# Patient Record
Sex: Male | Born: 1965
Health system: Southern US, Community
[De-identification: ages and names within clinical notes are randomized; demographics above are authoritative.]

## PROBLEM LIST (undated history)

## (undated) DIAGNOSIS — E119 Type 2 diabetes mellitus without complications: Secondary | ICD-10-CM

## (undated) DIAGNOSIS — I10 Essential (primary) hypertension: Secondary | ICD-10-CM

## (undated) DIAGNOSIS — E785 Hyperlipidemia, unspecified: Secondary | ICD-10-CM

---

## 2011-02-14 ENCOUNTER — Encounter: Payer: Self-pay | Admitting: Emergency Medicine

## 2011-02-14 ENCOUNTER — Inpatient Hospital Stay (INDEPENDENT_AMBULATORY_CARE_PROVIDER_SITE_OTHER)
Admission: RE | Admit: 2011-02-14 | Discharge: 2011-02-14 | Disposition: A | Discharge status: Home / Self Care | Payer: BC Managed Care – PPO | Source: Ambulatory Visit | Attending: Emergency Medicine | Admitting: Emergency Medicine

## 2011-02-14 DIAGNOSIS — I10 Essential (primary) hypertension: Secondary | ICD-10-CM | POA: Insufficient documentation

## 2011-02-14 DIAGNOSIS — E785 Hyperlipidemia, unspecified: Secondary | ICD-10-CM | POA: Insufficient documentation

## 2011-02-14 DIAGNOSIS — J0100 Acute maxillary sinusitis, unspecified: Secondary | ICD-10-CM

## 2011-02-14 DIAGNOSIS — J029 Acute pharyngitis, unspecified: Secondary | ICD-10-CM | POA: Insufficient documentation

## 2011-02-14 DIAGNOSIS — H66009 Acute suppurative otitis media without spontaneous rupture of ear drum, unspecified ear: Secondary | ICD-10-CM | POA: Insufficient documentation

## 2011-02-14 LAB — CONVERTED CEMR LAB: Rapid Strep: NEGATIVE

## 2011-08-05 NOTE — Progress Notes (Signed)
Summary: SORE THROAT (room 5)   Vital Signs:  Patient Profile:   45 Years Old Male CC:      URI and sore throat  x 2 days Height:     66 inches Weight:      175 pounds O2 Sat:      97 % O2 treatment:    Room Air Temp:     98.5 degrees F oral Pulse rate:   67 / minute Resp:     16 per minute BP sitting:   134 / 84  (left arm) Cuff size:   regular  Johnathan Warner. in pain?   no  Vitals Entered By: Lavell Islam RN (February 14, 2011 8:07 PM)                   Updated Prior Medication List: * OMLODEPHINE daily * ASA 81 MG 2 per day  Current Allergies: ! SULFAHistory of Present Illness Chief Complaint: URI and sore throat  x 2 days History of Present Illness: Johnathan Warner complains of 4 days of nasal and sinus congestion and sore throat. Green nasal mucus. + bilat ear pain.(intensity 4/10) Mild cough,NP. No dyspnea. No chest pain. No wheezing.  No nausea No vomiting.  + fever, No chills. No abd pain, N or V.  Tried otc meds without help.  REVIEW OF SYSTEMS Constitutional Symptoms      Denies fever, chills, night sweats, weight loss, weight gain, and fatigue.  Eyes       Denies change in vision, eye pain, eye discharge, glasses, contact lenses, and eye surgery. Ear/Nose/Throat/Mouth       Complains of ear pain and sore throat.      Denies hearing loss/aids, change in hearing, ear discharge, dizziness, frequent runny nose, frequent nose bleeds, sinus problems, hoarseness, and tooth pain or bleeding.  Respiratory       Complains of dry cough.      Denies productive cough, wheezing, shortness of breath, asthma, bronchitis, and emphysema/COPD.  Cardiovascular       Denies murmurs, chest pain, and tires easily with exhertion.    Gastrointestinal       Denies stomach pain, nausea/vomiting, diarrhea, constipation, blood in bowel movements, and indigestion. Genitourniary       Denies painful urination, kidney stones, and loss of urinary control. Neurological       Complains of headaches.       Denies paralysis, seizures, and fainting/blackouts. Musculoskeletal       Denies muscle pain, joint pain, joint stiffness, decreased range of motion, redness, swelling, muscle weakness, and gout.  Skin       Denies bruising, unusual mles/lumps or sores, and hair/skin or nail changes.  Psych       Denies mood changes, temper/anger issues, anxiety/stress, speech problems, depression, and sleep problems. Other Comments: URI and sore throat x 2 days   Past History:  Family History: Last updated: 02/14/2011 Family History Breast cancer 1st degree relative <50 Family History of CAD Male 1st degree relative <50 Family History Diabetes 1st degree relative  Social History: Last updated: 02/14/2011 Married Never Smoked Alcohol use-no Drug use-no  Past Medical History: Hyperlipidemia (had to d/c meds 3 months ago due to abnormal liver panel) Hypertension  Past Surgical History: Denies surgical history  Family History: Family History Breast cancer 1st degree relative <50 Family History of CAD Male 1st degree relative <50 Family History Diabetes 1st degree relative  Social History: Married Never Smoked Alcohol use-no Drug use-no Smoking Status:  never Drug Use:  no Physical Exam General appearance: well developed, well nourished, no acute distress. Fatigued. Head: normocephalic, atraumatic. + mild maxillary ttp bilat Eyes: conjunctivae and lids normal Ears: redsness, distortion, and inflammation bilateral TM's. canals wnl Nasal: swollen red turbinates with congestion and yellow-green mucus. Oral/Pharynx: pharyngeal erythema without exudate, uvula midline without deviation Neck: supple,anterior lymphadenopathy present Chest/Lungs: no rales, wheezes, or rhonchi bilateral, breath sounds equal without effort Heart: regular rate and  rhythm, no murmur Neurological: grossly intact and non-focal Skin: no obvious rashes or lesions MSE: oriented to time, place, and  person Assessment New Problems: ACUTE PHARYNGITIS (ICD-462) ACUTE MAXILLARY SINUSITIS (ICD-461.0) ACUT SUPPRATV OTITIS MEDIA W/O SPONT RUP EARDRUM (ICD-382.00) FAMILY HISTORY DIABETES 1ST DEGREE RELATIVE (ICD-V18.0) FAMILY HISTORY OF CAD MALE 1ST DEGREE RELATIVE <50 (ICD-V17.3) FAMILY HISTORY BREAST CANCER 1ST DEGREE RELATIVE <50 (ICD-V16.3) HYPERTENSION (ICD-401.9) HYPERLIPIDEMIA (ICD-272.4)  RST neg.  Patient Education: Patient and/or caregiver instructed in the following: rest, fluids, Tylenol prn, Ibuprofen prn. otc tx discussed. mucinex.   Plan New Medications/Changes: AUGMENTIN 875-125 MG TABS (AMOXICILLIN-POT CLAVULANATE) 1 by mouth two times a day pc for 10 days  #20 x 0, 02/14/2011, Lajean Manes MD  New Orders: Rapid Strep [id number] New Patient Level III [99203] Services provided After hours-Weekends-Holidays [99051]  The patient and/or caregiver has been counseled thoroughly with regard to medications prescribed including dosage, schedule, interactions, rationale for use, and possible side effects and they verbalize understanding.  Diagnoses and expected course of recovery discussed and will return if not improved as expected or if the condition worsens. Patient and/or caregiver verbalized understanding.  Prescriptions: AUGMENTIN 875-125 MG TABS (AMOXICILLIN-POT CLAVULANATE) 1 by mouth two times a day pc for 10 days  #20 x 0   Entered and Authorized by:   Lajean Manes MD   Signed by:   Lajean Manes MD on 02/14/2011   Method used:   Handwritten   RxID:   [id number]   Orders Added: 1)  Rapid Strep [95621] 2)  New Patient Level III [30865] 3)  Services provided After hours-Weekends-Holidays [78469]    Laboratory Results  Date/Time Received: February 14, 2011 8:12 PM  Date/Time Reported: February 14, 2011 8:12 PM   Other Tests  Rapid Strep: negative  Kit Test Internal QC: Negative   (Normal Range: Negative)

## 2012-07-09 ENCOUNTER — Emergency Department
Admission: EM | Admit: 2012-07-09 | Discharge: 2012-07-09 | Disposition: A | Discharge status: Home / Self Care | Payer: BC Managed Care – PPO | Source: Home / Self Care | Attending: Family Medicine | Admitting: Family Medicine

## 2012-07-09 ENCOUNTER — Encounter: Payer: Self-pay | Admitting: *Deleted

## 2012-07-09 DIAGNOSIS — J069 Acute upper respiratory infection, unspecified: Secondary | ICD-10-CM

## 2012-07-09 HISTORY — DX: Essential (primary) hypertension: I10

## 2012-07-09 HISTORY — DX: Type 2 diabetes mellitus without complications: E11.9

## 2012-07-09 HISTORY — DX: Hyperlipidemia, unspecified: E78.5

## 2012-07-09 MED ORDER — AMOXICILLIN 875 MG PO TABS: 875.0000 mg | ORAL_TABLET | Freq: Two times a day (BID) | ORAL | Status: DC

## 2012-07-09 MED ORDER — BENZONATATE 200 MG PO CAPS: 200.0000 mg | ORAL_CAPSULE | Freq: Every day | ORAL | Status: DC

## 2012-07-09 NOTE — ED Provider Notes (Signed)
History     CSN: [id number]  Arrival date & time 07/09/12  1807   First MD Initiated Contact with Patient 07/09/12 1821      Chief Complaint  Patient presents with  . Sore Throat      HPI Comments: Patient complains of onset yesterday of scratchy throat, sneezing, dizziness, fatigue, watery eyes, myalgias, and a mild cough.  No fevers, chills, and sweats   The history is provided by the patient.    Past Medical History  Diagnosis Date  . Hypertension   . Hyperlipemia   . Diabetes mellitus without complication     History reviewed. No pertinent past surgical history.  Family history:  Diabetes mother  History  Substance Use Topics  . Smoking status: Former Games developer  . Smokeless tobacco: Never Used  . Alcohol Use: No      Review of Systems + sore throat + cough No pleuritic pain No wheezing + nasal congestion + post-nasal drainage No sinus pain/pressure No itchy/red eyes No earache No hemoptysis No SOB No fever/chills No nausea No vomiting No abdominal pain No diarrhea No urinary symptoms No skin rashes + fatigue + myalgias No headache Used OTC meds without relief  Allergies  Sulfonamide derivatives  Home Medications   Current Outpatient Rx  Name  Route  Sig  Dispense  Refill  . GLIPIZIDE 10 MG PO TABS   Oral   Take 10 mg by mouth 2 (two) times daily before a meal.         . LISINOPRIL 10 MG PO TABS   Oral   Take 10 mg by mouth daily.         Marland Kitchen SIMVASTATIN 10 MG PO TABS   Oral   Take 10 mg by mouth at bedtime.         . AMOXICILLIN 875 MG PO TABS   Oral   Take 1 tablet (875 mg total) by mouth 2 (two) times daily. (Rx void after 07/17/12)   20 tablet   0   . BENZONATATE 200 MG PO CAPS   Oral   Take 1 capsule (200 mg total) by mouth at bedtime. Take as needed for cough   12 capsule   0     BP 121/78  Pulse 86  Temp 97.9 F (36.6 C) (Oral)  Resp 16  Ht 5\' 6"  (1.676 m)  Wt 183 lb 4 oz (83.122 kg)  BMI 29.58 kg/m2   SpO2 96%  Physical Exam Nursing notes and Vital Signs reviewed. Appearance:  Patient appears healthy, stated age, and in no acute distress Eyes:  Pupils are equal, round, and reactive to light and accomodation.  Extraocular movement is intact.  Conjunctivae are not inflamed  Ears:  Canals normal.  Tympanic membranes normal.  Nose:  Mildly congested turbinates.  No sinus tenderness.    Pharynx:  Normal Neck:  Supple.  Slightly tender shotty posterior nodes are palpated bilaterally  Lungs:  Clear to auscultation.  Breath sounds are equal.  Heart:  Regular rate and rhythm without murmurs, rubs, or gallops.  Abdomen:  Nontender without masses or hepatosplenomegaly.  Bowel sounds are present.  No CVA or flank tenderness.  Extremities:  No edema.  No calf tenderness Skin:  No rash present.   ED Course  Procedures  none      1. Acute upper respiratory infections of unspecified site; suspect early viral URI       MDM  There is no evidence of bacterial infection today.  Treat symptomatically for now:  Prescription written for Benzonatate [facility]) to take at bedtime for night-time cough.  Take Mucinex D (guaifenesin with decongestant) twice daily for congestion, or plain Mucinex plus Sudafed as needed.  Increase fluid intake, rest. May use Afrin nasal spray (or generic oxymetazoline) twice daily for about 5 days.  Also recommend using saline nasal spray several times daily and saline nasal irrigation (AYR is a common brand) Stop all antihistamines for now, and other non-prescription cough/cold preparations. May take Ibuprofen 200mg , 4 tabs every 8 hours with food for chest/sternum discomfort. Begin Amoxicillin if not improving about 5 days or if persistent fever develops (Given a prescription to hold, with an expiration date)  Follow-up with family doctor if not improving 7 to 10 days.         Lattie Haw, MD 07/09/12 [phone]

## 2012-07-09 NOTE — ED Notes (Signed)
Patient c/o itchy throat, watery eyes, and runny nose since yesterday.

## 2015-09-01 ENCOUNTER — Emergency Department
Admission: EM | Admit: 2015-09-01 | Discharge: 2015-09-01 | Disposition: A | Discharge status: Home / Self Care | Payer: BLUE CROSS/BLUE SHIELD | Source: Home / Self Care | Attending: Family Medicine | Admitting: Family Medicine

## 2015-09-01 ENCOUNTER — Encounter: Payer: Self-pay | Admitting: Emergency Medicine

## 2015-09-01 DIAGNOSIS — J069 Acute upper respiratory infection, unspecified: Secondary | ICD-10-CM

## 2015-09-01 DIAGNOSIS — H109 Unspecified conjunctivitis: Secondary | ICD-10-CM | POA: Diagnosis not present

## 2015-09-01 DIAGNOSIS — B9789 Other viral agents as the cause of diseases classified elsewhere: Principal | ICD-10-CM

## 2015-09-01 LAB — POCT INFLUENZA A/B
Influenza A, POC: NEGATIVE
Influenza B, POC: NEGATIVE

## 2015-09-01 MED ORDER — FLUTICASONE PROPIONATE 50 MCG/ACT NA SUSP: 2.0000 | Freq: Every day | NASAL | Status: DC

## 2015-09-01 MED ORDER — GENTAMICIN SULFATE 0.3 % OP SOLN: 1.0000 [drp] | OPHTHALMIC | Status: DC

## 2015-09-01 NOTE — Discharge Instructions (Signed)

## 2015-09-01 NOTE — ED Notes (Signed)
Patient reports onset of congestion, conjunctivitis and sense of fever yesterday; wife had same thing one week ago.

## 2015-09-01 NOTE — ED Provider Notes (Signed)
CSN: [id number]     Arrival date & time 09/01/15  1821 History   First MD Initiated Contact with Patient 09/01/15 1836     Chief Complaint  Patient presents with  . Nasal Congestion  . Conjunctivitis  . Chills   (Consider location/radiation/quality/duration/timing/severity/associated sxs/prior Treatment) HPI  Pt is a 49yo male presenting to [facility] with c/o hx of nasal congestion with bilateral eye redness, itching, and yellow crusting discharge that started yesterday.  Pt also reports mild sore throat and sneezing.  He has been taking Sudafed with temporary relief.  He reports subjective fever but denies chills, n/v/d. Pt notes his wife was sick about 1 week ago.  Past Medical History  Diagnosis Date  . Hypertension   . Hyperlipemia   . Diabetes mellitus without complication (HCC)    History reviewed. No pertinent past surgical history. History reviewed. No pertinent family history. Social History  Substance Use Topics  . Smoking status: Former Games developer  . Smokeless tobacco: Never Used  . Alcohol Use: No    Review of Systems  Constitutional: Positive for fever, chills and fatigue.  HENT: Positive for congestion, rhinorrhea, sinus pressure, sneezing and sore throat. Negative for ear pain, trouble swallowing and voice change.   Eyes: Positive for pain, discharge ( yellow crusting), redness and itching.  Respiratory: Negative for cough and shortness of breath.   Cardiovascular: Negative for chest pain and palpitations.  Gastrointestinal: Negative for nausea, vomiting, abdominal pain and diarrhea.  Musculoskeletal: Negative for myalgias, back pain and arthralgias.  Skin: Negative for rash.  All other systems reviewed and are negative.   Allergies  Sulfonamide derivatives  Home Medications   Prior to Admission medications   Medication Sig Start Date End Date Taking? Authorizing Provider  aspirin 81 MG tablet Take 81 mg by mouth daily.   Yes Historical Provider, MD  amoxicillin  (AMOXIL) 875 MG tablet Take 1 tablet (875 mg total) by mouth 2 (two) times daily. (Rx void after 07/17/12) 07/09/12   Lattie Haw, MD  benzonatate (TESSALON) 200 MG capsule Take 1 capsule (200 mg total) by mouth at bedtime. Take as needed for cough 07/09/12   Lattie Haw, MD  fluticasone (FLONASE) 50 MCG/ACT nasal spray Place 2 sprays into both nostrils daily. 09/01/15   Junius Finner, PA-C  gentamicin (GARAMYCIN) 0.3 % ophthalmic solution Place 1 drop into both eyes every 4 (four) hours. For 5 days 09/01/15   Junius Finner, PA-C  glipiZIDE (GLUCOTROL) 10 MG tablet Take 10 mg by mouth 2 (two) times daily before a meal.    Historical Provider, MD  lisinopril (PRINIVIL,ZESTRIL) 10 MG tablet Take 10 mg by mouth daily.    Historical Provider, MD  simvastatin (ZOCOR) 10 MG tablet Take 10 mg by mouth at bedtime.    Historical Provider, MD   Meds Ordered and Administered this Visit  Medications - No data to display  BP 118/80 mmHg  Pulse 84  Temp(Src) 98.4 F (36.9 C) (Oral)  Resp 16  Ht  (1.676 m)  Wt 180 lb (81.647 kg)  BMI 29.07 kg/m2  SpO2 97% No data found.   Physical Exam  Constitutional: He appears well-developed and well-nourished.  HENT:  Head: Normocephalic and atraumatic.  Right Ear: Hearing, tympanic membrane, external ear and ear canal normal.  Left Ear: Hearing, tympanic membrane, external ear and ear canal normal.  Nose: Mucosal edema and rhinorrhea present. Right sinus exhibits no maxillary sinus tenderness and no frontal sinus tenderness. Left sinus exhibits  no maxillary sinus tenderness and no frontal sinus tenderness.  Mouth/Throat: Uvula is midline and mucous membranes are normal. Posterior oropharyngeal erythema present. No oropharyngeal exudate, posterior oropharyngeal edema or tonsillar abscesses.  Eyes: EOM are normal. Pupils are equal, round, and reactive to light. Right eye exhibits discharge. Left eye exhibits discharge. Right conjunctiva is injected.  Left conjunctiva is injected. No scleral icterus.  Scant yellow crusting discharge bilaterally. No periorbital erythema or edema.   Neck: Normal range of motion. Neck supple.  Cardiovascular: Normal rate, regular rhythm and normal heart sounds.   Pulmonary/Chest: Effort normal and breath sounds normal. No respiratory distress. He has no wheezes. He has no rales. He exhibits no tenderness.  Abdominal: Soft. Bowel sounds are normal. He exhibits no distension and no mass. There is no tenderness. There is no rebound and no guarding.  Musculoskeletal: Normal range of motion.  Neurological: He is alert.  Skin: Skin is warm and dry.  Nursing note and vitals reviewed.   ED Course  Procedures (including critical care time)  Labs Review Labs Reviewed  POCT INFLUENZA A/B    Imaging Review No results found.    MDM   1. Viral URI with cough   2. Bilateral conjunctivitis    Pt c/o bilateral eye redness, itching and discharge with associated nasal congestion and sinus pressure.   Pt denies cough, chest pain or SOB.  No evidence of periorbital cellulitis. Rx: gentamicin ophthalmic drops 4x daily both eyes for 5 days. Flonase 2 sprays per nostril daily.  Advised pt to use acetaminophen and ibuprofen as needed for fever and pain. Encouraged rest and fluids. F/u with PCP in 7-10 days if not improving, sooner if worsening. Pt verbalized understanding and agreement with tx plan.     Junius FinnerErin O'Malley, PA-C 09/01/15 [phone]

## 2015-12-03 ENCOUNTER — Encounter: Payer: Self-pay | Admitting: Emergency Medicine

## 2015-12-03 ENCOUNTER — Emergency Department (INDEPENDENT_AMBULATORY_CARE_PROVIDER_SITE_OTHER)
Admission: EM | Admit: 2015-12-03 | Discharge: 2015-12-03 | Disposition: A | Discharge status: Home / Self Care | Payer: BLUE CROSS/BLUE SHIELD | Source: Home / Self Care | Attending: Family Medicine | Admitting: Family Medicine

## 2015-12-03 DIAGNOSIS — H6642 Suppurative otitis media, unspecified, left ear: Secondary | ICD-10-CM | POA: Diagnosis not present

## 2015-12-03 DIAGNOSIS — J209 Acute bronchitis, unspecified: Secondary | ICD-10-CM | POA: Diagnosis not present

## 2015-12-03 MED ORDER — BENZONATATE 200 MG PO CAPS: 200.0000 mg | ORAL_CAPSULE | Freq: Every day | ORAL | Status: DC

## 2015-12-03 MED ORDER — AZITHROMYCIN 250 MG PO TABS: ORAL_TABLET | ORAL | Status: DC

## 2015-12-03 NOTE — Discharge Instructions (Signed)
Take plain guaifenesin (1200mg  extended release tabs such as Mucinex) twice daily, with plenty of water, for cough and congestion.  Get adequate rest.   May use Afrin nasal spray (or generic oxymetazoline) twice daily for about 5 days and then discontinue.  Also recommend using saline nasal spray several times daily and saline nasal irrigation (AYR is a common brand).   Try warm salt water gargles for sore throat.  Stop all antihistamines for now, and other non-prescription cough/cold preparations.  Follow-up with family doctor if not improving about 8 days.

## 2015-12-03 NOTE — ED Notes (Signed)
Pt c/o cough for 2-3 weeks, congestion worse @ night, fever, taking Mucinex and Motrin

## 2015-12-03 NOTE — ED Provider Notes (Signed)
CSN: [id number]     Arrival date & time 12/03/15  1359 History   First MD Initiated Contact with Patient 12/03/15 1606     Chief Complaint  Patient presents with  . URI      HPI Comments: Patient developed a non-productive cough about 3 weeks ago that has persisted.  He has mild nasal congestion, low grade fever, and scratchy throat.    The history is provided by the patient.    Past Medical History  Diagnosis Date  . Hypertension   . Hyperlipemia   . Diabetes mellitus without complication (HCC)    History reviewed. No pertinent past surgical history. No family history on file. Social History  Substance Use Topics  . Smoking status: Former Games developer  . Smokeless tobacco: Never Used  . Alcohol Use: No    Review of Systems + mild sore throat + cough No pleuritic pain No wheezing + nasal congestion + post-nasal drainage No sinus pain/pressure No itchy/red eyes No earache No hemoptysis No SOB ? fever, + chills No nausea No vomiting No abdominal pain No diarrhea No urinary symptoms No skin rash + fatigue No myalgias No headache Used OTC meds without relief  Allergies  Sulfonamide derivatives  Home Medications   Prior to Admission medications   Medication Sig Start Date End Date Taking? Authorizing Provider  azithromycin (ZITHROMAX Z-PAK) 250 MG tablet Take 2 tabs today; then begin one tab once daily for 4 more days. 12/03/15   Lattie Haw, MD  benzonatate (TESSALON) 200 MG capsule Take 1 capsule (200 mg total) by mouth at bedtime. Take as needed for cough 12/03/15   Lattie Haw, MD  glipiZIDE (GLUCOTROL) 10 MG tablet Take 10 mg by mouth 2 (two) times daily before a meal.    Historical Provider, MD  lisinopril (PRINIVIL,ZESTRIL) 10 MG tablet Take 10 mg by mouth daily.    Historical Provider, MD  simvastatin (ZOCOR) 10 MG tablet Take 10 mg by mouth at bedtime.    Historical Provider, MD   Meds Ordered and Administered this Visit  Medications - No data to  display  BP 123/85 mmHg  Pulse 79  Temp(Src) 98.1 F (36.7 C) (Oral)  Ht  (1.676 m)  Wt 176 lb 12 oz (80.173 kg)  BMI 28.54 kg/m2  SpO2 95% No data found.   Physical Exam Nursing notes and Vital Signs reviewed. Appearance:  Patient appears stated age, and in no acute distress Eyes:  Pupils are equal, round, and reactive to light and accomodation.  Extraocular movement is intact.  Conjunctivae are not inflamed  Ears:  Canals normal.  Right tympanic membrane normal.  Left tympanic membrane erythematous with decreased landmarks. Nose:  Congested turbinates.  No sinus tenderness.   Pharynx:  Normal Neck:  Supple.  Tender enlarged posterior/lateral nodes are palpated bilaterally  Lungs:  Clear to auscultation.  Breath sounds are equal.  Moving air well. Heart:  Regular rate and rhythm without murmurs, rubs, or gallops.  Abdomen:  Nontender without masses or hepatosplenomegaly.  Bowel sounds are present.  No CVA or flank tenderness.  Extremities:  No edema.  Skin:  No rash present.   ED Course  Procedures  none  MDM   1. Acute bronchitis, unspecified organism   2. Suppurative otitis media of left ear without spontaneous rupture of tympanic membrane, recurrence not specified, unspecified chronicity    Begin Z-pak for atypical coverage. Prescription written for Benzonatate [facility]) to take at bedtime for night-time cough.  Take plain guaifenesin (1200mg  extended release tabs such as Mucinex) twice daily, with plenty of water, for cough and congestion.  Get adequate rest.   May use Afrin nasal spray (or generic oxymetazoline) twice daily for about 5 days and then discontinue.  Also recommend using saline nasal spray several times daily and saline nasal irrigation (AYR is a common brand).   Try warm salt water gargles for sore throat.  Stop all antihistamines for now, and other non-prescription cough/cold preparations.  Follow-up with family doctor if not improving about 8  days.    Lattie HawStephen A Beese, MD 12/03/15 2138

## 2015-12-30 ENCOUNTER — Emergency Department (INDEPENDENT_AMBULATORY_CARE_PROVIDER_SITE_OTHER)
Admission: EM | Admit: 2015-12-30 | Discharge: 2015-12-30 | Disposition: A | Discharge status: Home / Self Care | Payer: BLUE CROSS/BLUE SHIELD | Source: Home / Self Care | Attending: Family Medicine | Admitting: Family Medicine

## 2015-12-30 ENCOUNTER — Encounter: Payer: Self-pay | Admitting: Emergency Medicine

## 2015-12-30 ENCOUNTER — Emergency Department (INDEPENDENT_AMBULATORY_CARE_PROVIDER_SITE_OTHER): Payer: BLUE CROSS/BLUE SHIELD

## 2015-12-30 DIAGNOSIS — R05 Cough: Secondary | ICD-10-CM | POA: Diagnosis not present

## 2015-12-30 DIAGNOSIS — H9203 Otalgia, bilateral: Secondary | ICD-10-CM

## 2015-12-30 DIAGNOSIS — I10 Essential (primary) hypertension: Secondary | ICD-10-CM | POA: Diagnosis not present

## 2015-12-30 DIAGNOSIS — R053 Chronic cough: Secondary | ICD-10-CM

## 2015-12-30 LAB — POCT CBC W AUTO DIFF (K'VILLE URGENT CARE)

## 2015-12-30 MED ORDER — PREDNISONE 20 MG PO TABS: 20.0000 mg | ORAL_TABLET | Freq: Two times a day (BID) | ORAL | Status: DC

## 2015-12-30 MED ORDER — LOSARTAN POTASSIUM 25 MG PO TABS: 25.0000 mg | ORAL_TABLET | Freq: Every day | ORAL | Status: DC

## 2015-12-30 NOTE — ED Notes (Signed)
Pt c/o cough x 2-3 months, some congestion, bilateral ear pain.

## 2015-12-30 NOTE — ED Provider Notes (Addendum)
CSN: [id number]     Arrival date & time 12/30/15  1109 History   First MD Initiated Contact with Patient 12/30/15 1139     Chief Complaint  Patient presents with  . Cough      HPI Comments: Patient presents with two complaints: 1)  His ears feel clogged, although not especially painful.  He has mild sinus congestion. 2)  He complains of persistent non-productive cough for 2 to 3 months without pleuritic pain or shortness of breath.  He denies fevers, chills, and sweats.  He takes lisinopril for his blood pressure, and discontinued it several weeks ago.                                                                                                                                                          The history is provided by the patient.    Past Medical History  Diagnosis Date  . Hypertension   . Hyperlipemia   . Diabetes mellitus without complication (HCC)    History reviewed. No pertinent past surgical history. No family history on file. Social History  Substance Use Topics  . Smoking status: Former Games developermoker  . Smokeless tobacco: Never Used  . Alcohol Use: No    Review of Systems No sore throat + cough No pleuritic pain No wheezing + nasal congestion ? post-nasal drainage No sinus pain/pressure No itchy/red eyes ? Earache; ears feel clogged No hemoptysis No SOB No fever/chills No nausea No vomiting No abdominal pain No diarrhea No urinary symptoms No skin rash No fatigue No myalgias No headache    Allergies  Sulfonamide derivatives  Home Medications   Prior to Admission medications   Medication Sig Start Date End Date Taking? Authorizing Provider  glipiZIDE (GLUCOTROL) 10 MG tablet Take 10 mg by mouth 2 (two) times daily before a meal.    Historical Provider, MD  losartan (COZAAR) 25 MG tablet Take 1 tablet (25 mg total) by mouth daily. 12/30/15   Lattie HawStephen A Falyn Rubel, MD  predniSONE (DELTASONE) 20 MG tablet Take 1 tablet (20 mg total) by mouth 2 (two) times  daily. Take with food. 12/30/15   Lattie HawStephen A Ilijah Doucet, MD  simvastatin (ZOCOR) 10 MG tablet Take 10 mg by mouth at bedtime.    Historical Provider, MD   Meds Ordered and Administered this Visit  Medications - No data to display  BP 141/91 mmHg  Pulse 83  Temp(Src) 98.4 F (36.9 C) (Oral)  Ht 5\' 6"  (1.676 m)  Wt 177 lb 8 oz (80.513 kg)  BMI 28.66 kg/m2  SpO2 94% No data found.   Physical Exam Nursing notes and Vital Signs reviewed. Appearance:  Patient appears stated age, and in no acute distress Eyes:  Pupils are equal, round, and reactive to light and accomodation.  Extraocular movement is intact.  Conjunctivae are not inflamed  Ears:  Canals normal.  Tympanic membranes normal.  Nose:  Mildly congested turbinates.  No sinus tenderness.   Pharynx:  Normal Neck:  Supple.  No adenopathy  Lungs:  Clear to auscultation.  Breath sounds are equal.  Moving air well. Heart:  Regular rate and rhythm without murmurs, rubs, or gallops.  Abdomen:  Nontender without masses or hepatosplenomegaly.  Bowel sounds are present.  No CVA or flank tenderness.  Extremities:  No edema.  Skin:  No rash present.   ED Course  Procedures none    Labs Reviewed  POCT CBC W AUTO DIFF ([facility]):  WBC 7.0; LY 43.8; MO 7.3; GR 48.9; Hgb 14.7; Platelets 259   Tympanogram:  Left ear wide tympanogram; right ear "noisy" otherwise wide   Imaging Review No results found.    MDM   1. Persistent cough for 3 weeks or longer;  ?ACEI induced   2. Essential hypertension 3.  Otalgia   No evidence otitis media; suspect eustachian tube congestion Begin prednisone burst. Discontinue Lisinopril, and begin Losartan  daily. Followup with PCP in two weeks. Monitor blood pressure several times per week and record.    Lattie Haw, MD 01/09/16 1608  Lattie Haw, MD 01/09/16 [phone]

## 2015-12-30 NOTE — Discharge Instructions (Signed)
Monitor blood pressure several times per week and record.

## 2016-09-03 MED FILL — LOSARTAN POTASSIUM 25 MG TA: 25 | 30 days supply | Qty: 30 | Fill #0

## 2016-10-23 ENCOUNTER — Ambulatory Visit (INDEPENDENT_AMBULATORY_CARE_PROVIDER_SITE_OTHER): Payer: 59 | Admitting: Osteopathic Medicine

## 2016-10-23 ENCOUNTER — Encounter: Payer: Self-pay | Admitting: Osteopathic Medicine

## 2016-10-23 VITALS — BP 132/87 | HR 85 | Ht 66.0 in | Wt 170.0 lb

## 2016-10-23 DIAGNOSIS — I10 Essential (primary) hypertension: Secondary | ICD-10-CM | POA: Insufficient documentation

## 2016-10-23 DIAGNOSIS — E1165 Type 2 diabetes mellitus with hyperglycemia: Secondary | ICD-10-CM | POA: Diagnosis not present

## 2016-10-23 DIAGNOSIS — Z1231 Encounter for screening mammogram for malignant neoplasm of breast: Secondary | ICD-10-CM | POA: Diagnosis not present

## 2016-10-23 DIAGNOSIS — E785 Hyperlipidemia, unspecified: Secondary | ICD-10-CM

## 2016-10-23 DIAGNOSIS — E1169 Type 2 diabetes mellitus with other specified complication: Secondary | ICD-10-CM | POA: Insufficient documentation

## 2016-10-23 LAB — COMPLETE METABOLIC PANEL WITH GFR
ALT: 39 U/L (ref 9–46)
AST: 22 U/L (ref 10–35)
Albumin: 4.6 g/dL (ref 3.6–5.1)
Alkaline Phosphatase: 90 U/L (ref 40–115)
BUN: 16 mg/dL (ref 7–25)
CHLORIDE: 97 mmol/L — AB (ref 98–110)
CO2: 27 mmol/L (ref 20–31)
Calcium: 9.9 mg/dL (ref 8.6–10.3)
Creat: 1.04 mg/dL (ref 0.70–1.33)
GFR, Est African American: >89 mL/min (ref >=60)
GFR, Est Non African American: 83 mL/min (ref >=60)
GLUCOSE: 279 mg/dL — AB (ref 65–99)
POTASSIUM: 4.4 mmol/L (ref 3.5–5.3)
SODIUM: 136 mmol/L (ref 135–146)
Total Bilirubin: 0.6 mg/dL (ref 0.2–1.2)
Total Protein: 7.8 g/dL (ref 6.1–8.1)

## 2016-10-23 LAB — CBC WITH DIFFERENTIAL/PLATELET
BASOS ABS: 76 {cells}/uL (ref 0–200)
Basophils Relative: 1 %
EOS ABS: 228 {cells}/uL (ref 15–500)
Eosinophils Relative: 3 %
HEMATOCRIT: 48.6 % (ref 38.5–50.0)
HEMOGLOBIN: 16.1 g/dL (ref 13.2–17.1)
LYMPHS ABS: 2812 {cells}/uL (ref 850–3900)
LYMPHS PCT: 37 %
MCH: 30.5 pg (ref 27.0–33.0)
MCHC: 33.1 g/dL (ref 32.0–36.0)
MCV: 92 fL (ref 80.0–100.0)
MONO ABS: 380 {cells}/uL (ref 200–950)
MPV: 9.9 fL (ref 7.5–12.5)
Monocytes Relative: 5 %
NEUTROS PCT: 54 %
Neutro Abs: 4104 cells/uL (ref 1500–7800)
Platelets: 348 10*3/uL (ref 140–400)
RBC: 5.28 MIL/uL (ref 4.20–5.80)
RDW: 12.8 % (ref 11.0–15.0)
WBC: 7.6 10*3/uL (ref 3.8–10.8)

## 2016-10-23 LAB — POCT GLYCOSYLATED HEMOGLOBIN (HGB A1C): HEMOGLOBIN A1C: 11.9

## 2016-10-23 LAB — HEMOGLOBIN A1C
Hgb A1c MFr Bld: 10.4 % — ABNORMAL HIGH (ref <5.7)
MEAN PLASMA GLUCOSE: 252 mg/dL

## 2016-10-23 MED ORDER — GLUCOSE BLOOD VI STRP
ORAL_STRIP | 99 refills | Status: DC
Start: 2016-10-23 — End: 2016-10-23

## 2016-10-23 MED ORDER — LOSARTAN POTASSIUM 25 MG PO TABS: 25.0000 mg | ORAL_TABLET | Freq: Every day | ORAL | 3 refills | Status: DC

## 2016-10-23 MED ORDER — METFORMIN HCL ER 500 MG PO TB24: 1000.0000 mg | ORAL_TABLET | Freq: Two times a day (BID) | ORAL | 5 refills | Status: DC

## 2016-10-23 MED ORDER — SIMVASTATIN 40 MG PO TABS: 40.0000 mg | ORAL_TABLET | Freq: Every day | ORAL | 3 refills | Status: DC

## 2016-10-23 MED ORDER — FREESTYLE LANCETS MISC: 99 refills | Status: DC

## 2016-10-23 MED FILL — FREESTYLE INSULINX TEST STR: 25 days supply | Qty: 100 | Fill #0 | Status: TO

## 2016-10-23 MED FILL — SIMVASTATIN 40 MG TABLET: 40 | 90 days supply | Qty: 90 | Fill #0 | Status: TO

## 2016-10-23 MED FILL — LOSARTAN POTASSIUM 25 MG TA: 25 | 90 days supply | Qty: 90 | Fill #0

## 2016-10-23 MED FILL — METFORMIN HCL ER 500 MG TAB: 500 | 30 days supply | Qty: 120 | Fill #0

## 2016-10-23 MED FILL — FREESTYLE LANCETS: 25 days supply | Qty: 100 | Fill #0 | Status: TO

## 2016-10-23 NOTE — Patient Instructions (Addendum)
   A1c today indicates poorly controlled diabetes. I'm going to confirm the levels with blood draw, we are also going to double check kidney and liver function before starting any new medications, but based on current diabetes care guidelines we will need to increase your medications at this point.   Unless labs are significantly abnormal, we will plan to increase the dose of your metformin to 1000 mg twice daily, we are going to add 2 other oral medications: Empagliflozin (Jardiance) and Sitagliptin (Januvia) or similar alternatives based on what your insurance will cover.   Our goal for your A1c is at least to get under 8%, ideally we would like you to be at 7%. Currently you are at 11.9% based on the lab that we got in the clinic today. If A1c is not getting to goal with diet/exercise changes and increased medications orally, we will need to discuss starting insulin.   Be checking your fasting sugars daily, bring a record of these to the office. We will schedule a follow-up with me in the next 6 weeks to see how the sugars are looking, we will plan to repeat the A1c 3 months from now.

## 2016-10-23 NOTE — Progress Notes (Addendum)
HPI: Johnathan Warner is a 51 y.o. male  who [facility] today, 10/23/16,  for chief complaint of:  Chief Complaint  Patient presents with  . Establish Care    DM2: New patient establish care, needs refills on diabetes medications and recheck of labs. Reports that he could be doing a bit better as far as diet/exercise is concerned. DIABETES SCREENING/PREVENTIVE CARE: Updated 10/23/16  A1C past 3-6 mos: Yes  controlled? No!  10/23/16 11.9%  01/19/16 8.3%  BP goal <140/90: Yes  LDL goal <70-100: await labs Eye exam annually: No , importance discussed with patient Foot exam: No  Microalbuminuria: n/a Metformin: Yes  ACE/ARB: Yes  Antiplatelet if ASCVD Risk >10%: Yes  Statin: Yes  Pneumovax: No - declined  Hypertension: Well-controlled on current medications. No home blood pressures to report.  Hyperlipidemia: Reports that he could be getting better on low-fat diet. Would like to avoid checking cholesterol until he can adjust diet a bit.     Past medical, surgical, social and family history reviewed: Patient Active Problem List   Diagnosis Date Noted  . Type 2 diabetes mellitus with hyperglycemia, without long-term current use of insulin (HCC) 10/23/2016  . Essential hypertension 10/23/2016  . Hyperlipidemia associated with type 2 diabetes mellitus (HCC) 10/23/2016   History reviewed. No pertinent surgical history. Social History  Substance Use Topics  . Smoking status: Former Games developermoker  . Smokeless tobacco: Never Used  . Alcohol use No   History reviewed. No pertinent family history.   Current medication list and allergy/intolerance information reviewed:   Current Outpatient Prescriptions  Medication Sig Dispense Refill  . glipiZIDE (GLUCOTROL) 10 MG tablet Take 10 mg by mouth 2 (two) times daily before a meal.    . glucose blood (FREESTYLE TEST STRIPS) test strip Once daily    . losartan (COZAAR) 25 MG tablet Take 1 tablet  (25 mg total) by mouth daily. 15 tablet 1  . metFORMIN (GLUCOPHAGE-XR) 500 MG 24 hr tablet Take 500 mg by mouth 2 (two) times daily.    . simvastatin (ZOCOR) 40 MG tablet Take 40 mg by mouth daily.    Marland Kitchen. aspirin EC 81 MG tablet Take 81 mg by mouth.    . Lancets (FREESTYLE) lancets Use as instructed 100 each 99   No current facility-administered medications for this visit.    Allergies  Allergen Reactions  . Sulfonamide Derivatives       Review of Systems:  Constitutional:  No  fever, no chills, No recent illness,  HEENT: No  headache, no vision change  Cardiac: No  chest pain, No  pressure, No palpitations, No  Orthopnea  Respiratory:  No  shortness of breath. No  Cough  Gastrointestinal: No  abdominal pain, No  nausea, No  vomiting,  No  blood in stool, No  diarrhea, No  constipation   Musculoskeletal: No new myalgia/arthralgia  Skin: No  Rash, No other wounds/concerning lesions  Hem/Onc: No  easy bruising/bleeding, No  abnormal lymph node  Endocrine: No cold intolerance,  No heat intolerance. No polyuria/polydipsia/polyphagia   Neurologic: No  weakness, No  dizziness  Psychiatric: No  concerns with depression, No  concerns with anxiety, No sleep problems, No mood problems  Exam:  BP 132/87   Pulse 85   Ht 5\' 6"  (1.676 m)   Wt 170 lb (77.1 kg)   BMI 27.44 kg/m   Constitutional: VS see above. General Appearance: alert, well-developed, well-nourished, NAD  Eyes: Normal  lids and conjunctive, non-icteric sclera  Ears, Nose, Mouth, Throat: MMM, Normal external inspection ears/nares/mouth/lips/gums.  Neck: No masses, trachea midline. No thyroid enlargement. No tenderness/mass appreciated. No lymphadenopathy  Respiratory: Normal respiratory effort. no wheeze, no rhonchi, no rales  Cardiovascular: S1/S2 normal, no murmur, no rub/gallop auscultated. RRR. No lower extremity edema. Pedal pulse II/IV bilaterally DP and PT. No carotid bruit or JVD. No abdominal aortic  bruit.  Musculoskeletal: Gait normal. No clubbing/cyanosis of digits.   Neurological: Normal balance/coordination. No tremor.   Skin: warm, dry, intact. No rash/ulcer.   Psychiatric: Normal judgment/insight. Normal mood and affect. Oriented x3.    Results for orders placed or performed in visit on 10/23/16 (from the past 72 hour(s))  POCT HgB A1C     Status: None   Collection Time: 10/23/16 11:31 AM  Result Value Ref Range   Hemoglobin A1C 11.9       ASSESSMENT/PLAN:   Type 2 diabetes mellitus with hyperglycemia, without long-term current use of insulin (HCC) - Discussed A1c goals and triple therapy with patient, lifestyle changes,  he may end up on insulin He does not want injectables/insulin at this time. - Plan: POCT HgB A1C, Lancets (FREESTYLE) lancets, COMPLETE METABOLIC PANEL WITH GFR, CBC with Differential/Platelet, Hemoglobin A1c, Amb ref to Medical Nutrition Therapy-MNT, aspirin EC 81 MG tablet, glucose blood (FREESTYLE TEST STRIPS) test strip, losartan (COZAAR) 25 MG tablet, metFORMIN (GLUCOPHAGE-XR) 500 MG 24 hr tablet, simvastatin (ZOCOR) 40 MG tablet, sitaGLIPtin (JANUVIA) 100 MG tablet, empagliflozin (JARDIANCE) 25 MG TABS tablet, DISCONTINUED: glucose blood test strip  Essential hypertension - Plan: aspirin EC 81 MG tablet, losartan (COZAAR) 25 MG tablet  Hyperlipidemia associated with type 2 diabetes mellitus (HCC) - Plan: simvastatin (ZOCOR) 40 MG tablet    Patient Instructions   A1c today indicates poorly controlled diabetes. I'm going to confirm the levels with blood draw, we are also going to double check kidney and liver function before starting any new medications, but based on current diabetes care guidelines we will need to increase your medications at this point.   Unless labs are significantly abnormal, we will plan to increase the dose of your metformin to 1000 mg twice daily, we are going to add 2 other oral medications: Empagliflozin (Jardiance) and  Sitagliptin (Januvia) or similar alternatives based on what your insurance will cover.   Our goal for your A1c is at least to get under 8%, ideally we would like you to be at 7%. Currently you are at 11.9% based on the lab that we got in the clinic today. If A1c is not getting to goal with diet/exercise changes and increased medications orally, we will need to discuss starting insulin.   Be checking your fasting sugars daily, bring a record of these to the office. We will schedule a follow-up with me in the next 6 weeks to see how the sugars are looking, we will plan to repeat the A1c 3 months from now.     Visit summary with medication list and pertinent instructions was printed for patient to review. All questions at time of visit were answered - patient instructed to contact office with any additional concerns. ER/RTC precautions were reviewed with the patient. Follow-up plan: Return in about 6 weeks (around 12/04/2016) for diabtes followup - review sugars.   Addendum: 10/24/2016 after review of labs Confirmed A1c greater than 10, initiate triple therapy with oral medications

## 2016-10-24 MED ORDER — SITAGLIPTIN PHOSPHATE 100 MG PO TABS: 100.0000 mg | ORAL_TABLET | Freq: Every day | ORAL | 1 refills | Status: DC

## 2016-10-24 MED ORDER — EMPAGLIFLOZIN 25 MG PO TABS: 25.0000 mg | ORAL_TABLET | Freq: Every day | ORAL | 1 refills | Status: DC

## 2016-10-24 MED FILL — JANUVIA 100 MG TABLET: 100 | 30 days supply | Qty: 30 | Fill #0

## 2016-10-24 NOTE — Addendum Note (Signed)
Addended by: Deirdre PippinsALEXANDER, Prince Olivier M on: 10/24/2016 09:46 AM   Modules accepted: Orders

## 2016-10-30 ENCOUNTER — Telehealth: Payer: Self-pay

## 2016-10-30 NOTE — Telephone Encounter (Signed)
Pre authorization (Key: [id number]) for jardiance was approved. Pharmacy was notified. Pt was informed that Rx was ready for pick-up at pharmacy.

## 2016-12-06 ENCOUNTER — Ambulatory Visit: Payer: 59 | Admitting: Osteopathic Medicine

## 2017-01-17 ENCOUNTER — Ambulatory Visit: Payer: 59 | Admitting: Osteopathic Medicine

## 2017-01-28 MED FILL — LOSARTAN POTASSIUM 25 MG TA: 25 | 90 days supply | Qty: 90 | Fill #1

## 2017-01-28 MED FILL — METFORMIN HCL ER 500 MG TAB: 500 | 30 days supply | Qty: 120 | Fill #1

## 2017-03-07 ENCOUNTER — Ambulatory Visit: Payer: 59 | Admitting: Osteopathic Medicine

## 2017-03-10 MED FILL — METFORMIN HCL ER 500 MG TAB: 500 | 30 days supply | Qty: 120 | Fill #2

## 2017-04-14 MED FILL — METFORMIN HCL ER 500 MG TAB: 500 | 60 days supply | Qty: 240 | Fill #3 | Status: TO

## 2017-05-02 ENCOUNTER — Ambulatory Visit: Payer: 59 | Admitting: Osteopathic Medicine

## 2017-05-02 MED FILL — LOSARTAN POTASSIUM 25 MG TA: 25 | 90 days supply | Qty: 90 | Fill #2 | Status: TO

## 2017-05-19 MED FILL — JANUVIA 100 MG TABLET: 100 | 30 days supply | Qty: 30 | Fill #0

## 2017-05-19 MED FILL — FREESTYLE LANCETS: 25 days supply | Qty: 100 | Fill #0

## 2017-05-19 MED FILL — FREESTYLE INSULINX TEST STR: 25 days supply | Qty: 100 | Fill #0

## 2017-06-04 ENCOUNTER — Ambulatory Visit: Payer: 59 | Admitting: Osteopathic Medicine

## 2017-06-05 ENCOUNTER — Ambulatory Visit (INDEPENDENT_AMBULATORY_CARE_PROVIDER_SITE_OTHER): Payer: 59 | Admitting: Osteopathic Medicine

## 2017-06-05 ENCOUNTER — Encounter: Payer: Self-pay | Admitting: Osteopathic Medicine

## 2017-06-05 VITALS — BP 126/85 | HR 78 | Ht 66.0 in | Wt 174.0 lb

## 2017-06-05 DIAGNOSIS — I10 Essential (primary) hypertension: Secondary | ICD-10-CM

## 2017-06-05 DIAGNOSIS — E119 Type 2 diabetes mellitus without complications: Secondary | ICD-10-CM

## 2017-06-05 DIAGNOSIS — E1165 Type 2 diabetes mellitus with hyperglycemia: Secondary | ICD-10-CM

## 2017-06-05 DIAGNOSIS — E785 Hyperlipidemia, unspecified: Secondary | ICD-10-CM | POA: Diagnosis not present

## 2017-06-05 DIAGNOSIS — E1169 Type 2 diabetes mellitus with other specified complication: Secondary | ICD-10-CM

## 2017-06-05 LAB — COMPLETE METABOLIC PANEL WITH GFR
AG RATIO: 1.6 (calc) (ref 1.0–2.5)
ALKALINE PHOSPHATASE (APISO): 68 U/L (ref 40–115)
ALT: 43 U/L (ref 9–46)
AST: 29 U/L (ref 10–35)
Albumin: 4.5 g/dL (ref 3.6–5.1)
BILIRUBIN TOTAL: 0.6 mg/dL (ref 0.2–1.2)
BUN: 9 mg/dL (ref 7–25)
CHLORIDE: 101 mmol/L (ref 98–110)
CO2: 30 mmol/L (ref 20–32)
Calcium: 9.7 mg/dL (ref 8.6–10.3)
Creat: 0.8 mg/dL (ref 0.70–1.33)
GFR, EST AFRICAN AMERICAN: 120 mL/min/{1.73_m2} (ref >=60)
GFR, Est Non African American: 103 mL/min/{1.73_m2} (ref >=60)
Globulin: 2.8 g/dL (calc) (ref 1.9–3.7)
Glucose, Bld: 126 mg/dL — ABNORMAL HIGH (ref 65–99)
POTASSIUM: 4.6 mmol/L (ref 3.5–5.3)
Sodium: 139 mmol/L (ref 135–146)
TOTAL PROTEIN: 7.3 g/dL (ref 6.1–8.1)

## 2017-06-05 LAB — LIPID PANEL
CHOL/HDL RATIO: 4.6 (calc) (ref <5.0)
Cholesterol: 128 mg/dL (ref <200)
HDL: 28 mg/dL — AB (ref >40)
LDL Cholesterol (Calc): 66 mg/dL (calc)
Non-HDL Cholesterol (Calc): 100 mg/dL (calc) (ref <130)
Triglycerides: 243 mg/dL — ABNORMAL HIGH (ref <150)

## 2017-06-05 LAB — POCT GLYCOSYLATED HEMOGLOBIN (HGB A1C): HEMOGLOBIN A1C: 7.7

## 2017-06-05 MED ORDER — BLOOD GLUCOSE MONITOR KIT: PACK | 99 refills | Status: DC

## 2017-06-05 MED ORDER — METFORMIN HCL ER 500 MG PO TB24: 1000.0000 mg | ORAL_TABLET | Freq: Two times a day (BID) | ORAL | 5 refills | Status: DC

## 2017-06-05 MED ORDER — ASPIRIN EC 81 MG PO TBEC: 81.0000 mg | DELAYED_RELEASE_TABLET | Freq: Every day | ORAL | 3 refills | Status: DC

## 2017-06-05 MED ORDER — EMPAGLIFLOZIN 25 MG PO TABS: 25.0000 mg | ORAL_TABLET | Freq: Every day | ORAL | 1 refills | Status: DC

## 2017-06-05 MED ORDER — SITAGLIPTIN PHOSPHATE 100 MG PO TABS: 100.0000 mg | ORAL_TABLET | Freq: Every day | ORAL | 1 refills | Status: DC

## 2017-06-05 NOTE — Patient Instructions (Addendum)
Plan:  Your A1C (3 months measurement of sugar levels) is a bit above goal. Lets make sure you are on all three medications: metformin + januvia + jardiance to get you to a goal A1C of less than 7.0%.    Every 3 months, we should be checking A1C levels in the office.   Will get blood work today to make sure your kidneys are tolerating the medicines.   For billing questions: please call the number on your bill.If further questions, please call our office and ask to speak to Toniann Fail, our Research officer, political party. Your visit from February was NOT a preventive care physical. It was billed as a new patient problem-based visit based on the issues we discussed that day.    Hemoglobin A1c Test Some of the sugar (glucose) that circulates in your blood sticks or binds to blood proteins. Hemoglobin (Hb or Hgb) is one type of blood protein that glucose binds to. It also carries oxygen in the red blood cells (RBCs). When glucose binds to Hb, the glucose-coated Hb is called glycated Hb. Once Hb is glycated, it remains that way for the life of the RBC. This is about 120 days. Rather than testing your blood glucose level on one single day, the hemoglobin A1c (HbA1c) test measures the average amount of glycated hemoglobin and, therefore, the average amount of glucose in your blood during the 3-4 months just before the test is done. The HbA1c test is used to monitor long-term control of blood sugar in people who have diabetes mellitus. The HbA1c test can also be used in addition to or in combination with fasting blood glucose level and oral glucose tolerance tests. What do the results mean? It is your responsibility to obtain your test results. Ask the lab or department performing the test when and how you will get your results. Contact your health care provider to discuss any questions you have about your results. Range of Normal Values Ranges for normal values may vary among different labs and hospitals. You should  always check with your health care provider after having lab work or other tests done to discuss the meaning of your test results and whether your values are considered within normal limits. The ranges for normal HbA1c test results are as follows:  Adult or child without diabetes: 4-5.9%.  Adult or child with diabetes and good blood glucose control: less than 6.5%.  Several factors can affect HbA1c test results. These may include:  Diseases (hemoglobinopathies) that cause a change in the shape, size, or amount of Hb in your blood.  Longer than normal RBC life span.  Abnormally low levels of certain proteins in your blood.  Eating foods or taking supplements that are high in vitamin C (ascorbic acid).  Meaning of Results Outside Normal Value Ranges Abnormally high HbA1c values are most commonly an indication of prediabetes mellitus and diabetes mellitus:  An HbA1c result of 5.7-6.4% is considered diagnostic of prediabetes mellitus.  An HbA1c result of 6.5% or higher on two separate occasions is considered diagnostic of diabetes mellitus.  Abnormally low HbA1c values can be caused by several health conditions. These may include:  Pregnancy.  A large amount of blood loss.  Blood transfusions.  Low red blood cell count (anemia). This is caused by premature destruction of red blood cells.  Long-term kidney failure.  Some unusual forms of Hb (Hb variants), such as sickle cell trait.  Discuss your test results with your health care provider. He or she will use  the results to make a diagnosis and determine a treatment plan that is right for you. Talk with your health care provider to discuss your results, treatment options, and if necessary, the need for more tests. Talk with your health care provider if you have any questions about your results. This information is not intended to replace advice given to you by your health care provider. Make sure you discuss any questions you have  with your health care provider. Document Released: 09/10/2004 Document Revised: 05/15/2016 Document Reviewed: 01/03/2014 Elsevier Interactive Patient Education  2017 [employer].

## 2017-06-05 NOTE — Progress Notes (Signed)
HPI: Johnathan Warner is a 51 y.o. male  who presents to [facility] today, 06/05/17,  for chief complaint of:  Chief Complaint  Patient presents with  . Follow-up    diabetes    Patient is significantly overdue for diabetes follow-up. Last seen here in February, did not follow up as directed 6 weeks after that visit for rechecking sugars.  At that point, A1c was 10.4, patient was resistant to starting insulin so we escalated to triple therapy. Patient states he is currently only on metformin and jarred and. At last visit in February and only gave him 30 day supply of these things with the refills are not sure how long he's actually been taking them or where he might be getting these medicines from. He is a bit unclear.  Patient states that he has made some adjustments to diet, has not been working out as much as he could be but plans to start an exercise regimen.  DIABETES SCREENING/PREVENTIVE CARE: Updated 06/05/17  A1C past 3-6 mos: Yes  controlled? No!  01/19/16 8.3%   10/23/16: 10.4% - initiated triple therapy: Jardiance 25 daily, Januvia 100 daily, metformin XR 1000 daily  06/05/17: 7.7% yay!  BP goal <140/90: Yes  LDL goal <70-100: await labs Eye exam annually: No , importance discussed with patient Foot exam: No  Microalbuminuria: n/a Metformin: Yes  ACE/ARB: Yes  Antiplatelet if ASCVD Risk >10%: Yes  Statin: Yes  Pneumovax: No - declined  Hypertension: Well-controlled on current medications. No home blood pressures to report. He says on home monitor these are up and down a bit, we do not have verified blood pressure cuff      Past medical, surgical, social and family history reviewed: Patient Active Problem List   Diagnosis Date Noted  . Type 2 diabetes mellitus with hyperglycemia, without long-term current use of insulin ([place]) 10/23/2016  . Essential hypertension 10/23/2016  . Hyperlipidemia associated with type 2 diabetes  mellitus ([place]) 10/23/2016   No past surgical history on file. Social History  Substance Use Topics  . Smoking status: Former Smoker    Quit date: 1994  . Smokeless tobacco: Never Used  . Alcohol use No   No family history on file.   Current medication list and allergy/intolerance information reviewed:   Current Outpatient Prescriptions  Medication Sig Dispense Refill  . aspirin EC 81 MG tablet Take 81 mg by mouth.    . empagliflozin (JARDIANCE) 25 MG TABS tablet Take 25 mg by mouth daily. 30 tablet 1  . glucose blood (FREESTYLE TEST STRIPS) test strip Once daily    . Lancets (FREESTYLE) lancets Use as instructed 100 each 99  . losartan (COZAAR) 25 MG tablet Take 1 tablet (25 mg total) by mouth daily. 90 tablet 3  . metFORMIN (GLUCOPHAGE-XR) 500 MG 24 hr tablet Take 2 tablets (1,000 mg total) by mouth 2 (two) times daily. 120 tablet 5  . simvastatin (ZOCOR) 40 MG tablet Take 1 tablet (40 mg total) by mouth daily. 90 tablet 3  . sitaGLIPtin (JANUVIA) 100 MG tablet Take 1 tablet (100 mg total) by mouth daily. 30 tablet 1   No current facility-administered medications for this visit.    Allergies  Allergen Reactions  . Sulfonamide Derivatives       Review of Systems:  Constitutional:  No  fever, no chills, No recent illness,  HEENT: No  headache, no vision change  Cardiac: No  chest pain, No  pressure, No palpitation  Respiratory:  No  shortness of breath.  Endocrine: No cold intolerance,  No heat intolerance. No polyuria/polydipsia/polyphagia   Neurologic: No  weakness, No  dizziness   Exam:  BP 126/85   Pulse 78   Ht '5\' 6"'$  (1.676 m)   Wt 174 lb (78.9 kg)   BMI 28.08 kg/m   Constitutional: VS see above. General Appearance: alert, well-developed, well-nourished, NAD  Eyes: Normal lids and conjunctive, non-icteric sclera  Ears, Nose, Mouth, Throat: MMM, Normal external inspection ears/nares/mouth/lips/gums.  Neck: No masses, trachea midline. No thyroid  enlargement. No tenderness/mass appreciated. No lymphadenopathy  Respiratory: Normal respiratory effort. no wheeze, no rhonchi, no rales  Cardiovascular: S1/S2 normal, no murmur, no rub/gallop auscultated. RRR. No lower extremity edema.   Musculoskeletal: Gait normal. No clubbing/cyanosis of digits.   Neurological: Normal balance/coordination. No tremor.   Skin: warm, dry, intact. No rash/ulcer.   Psychiatric: Normal judgment/insight. Normal mood and affect. Oriented x3.    Results for orders placed or performed in visit on 06/05/17 (from the past 72 hour(s))  POCT HgB A1C     Status: None   Collection Time: 06/05/17  9:54 AM  Result Value Ref Range   Hemoglobin A1C 7.7       ASSESSMENT/PLAN:   Diabetes mellitus without complication ([place]) - Plan: POCT HgB A1C, COMPLETE METABOLIC PANEL WITH GFR, Lipid panel  Essential hypertension - Plan: COMPLETE METABOLIC PANEL WITH GFR, Lipid panel, aspirin EC 81 MG tablet  Hyperlipidemia associated with type 2 diabetes mellitus ([place]) - Plan: COMPLETE METABOLIC PANEL WITH GFR, Lipid panel  Type 2 diabetes mellitus with hyperglycemia, without long-term current use of insulin (HCC) - Discussed A1c goals and triple therapy with patient, lifestyle changes,  he may end up on insulin He does not want injectables/insulin at this time. - Plan: aspirin EC 81 MG tablet, empagliflozin (JARDIANCE) 25 MG TABS tablet, metFORMIN (GLUCOPHAGE-XR) 500 MG 24 hr tablet, sitaGLIPtin (JANUVIA) 100 MG tablet, blood glucose meter kit and supplies KIT    Patient Instructions  Plan:  Your A1C (3 months measurement of sugar levels) is a bit above goal. Lets make sure you are on all three medications: metformin + januvia + jardiance to get you to a goal A1C of less than 7.0%.    Every 3 months, we should be checking A1C levels in the office.   Will get blood work today to make sure your kidneys are tolerating the medicines.   For billing questions: please call the  number on your bill.If further questions, please call our office and ask to speak to Abigail Butts, our Engineer, building services. Your visit from February was NOT a preventive care physical. It was billed as a new patient problem-based visit based on the issues we discussed that day.    Hemoglobin A1c Test Some of the sugar (glucose) that circulates in your blood sticks or binds to blood proteins. Hemoglobin (Hb or Hgb) is one type of blood protein that glucose binds to. It also carries oxygen in the red blood cells (RBCs). When glucose binds to Hb, the glucose-coated Hb is called glycated Hb. Once Hb is glycated, it remains that way for the life of the RBC. This is about 120 days. Rather than testing your blood glucose level on one single day, the hemoglobin A1c (HbA1c) test measures the average amount of glycated hemoglobin and, therefore, the average amount of glucose in your blood during the 3-4 months just before the test is done. The HbA1c test is used to monitor long-term  control of blood sugar in people who have diabetes mellitus. The HbA1c test can also be used in addition to or in combination with fasting blood glucose level and oral glucose tolerance tests. What do the results mean? It is your responsibility to obtain your test results. Ask the lab or department performing the test when and how you will get your results. Contact your health care provider to discuss any questions you have about your results. Range of Normal Values Ranges for normal values may vary among different labs and hospitals. You should always check with your health care provider after having lab work or other tests done to discuss the meaning of your test results and whether your values are considered within normal limits. The ranges for normal HbA1c test results are as follows:  Adult or child without diabetes: 4-5.9%.  Adult or child with diabetes and good blood glucose control: less than 6.5%.  Several factors can affect  HbA1c test results. These may include:  Diseases (hemoglobinopathies) that cause a change in the shape, size, or amount of Hb in your blood.  Longer than normal RBC life span.  Abnormally low levels of certain proteins in your blood.  Eating foods or taking supplements that are high in vitamin C (ascorbic acid).  Meaning of Results Outside Normal Value Ranges Abnormally high HbA1c values are most commonly an indication of prediabetes mellitus and diabetes mellitus:  An HbA1c result of 5.7-6.4% is considered diagnostic of prediabetes mellitus.  An HbA1c result of 6.5% or higher on two separate occasions is considered diagnostic of diabetes mellitus.  Abnormally low HbA1c values can be caused by several health conditions. These may include:  Pregnancy.  A large amount of blood loss.  Blood transfusions.  Low red blood cell count (anemia). This is caused by premature destruction of red blood cells.  Long-term kidney failure.  Some unusual forms of Hb (Hb variants), such as sickle cell trait.  Discuss your test results with your health care provider. He or she will use the results to make a diagnosis and determine a treatment plan that is right for you. Talk with your health care provider to discuss your results, treatment options, and if necessary, the need for more tests. Talk with your health care provider if you have any questions about your results. This information is not intended to replace advice given to you by your health care provider. Make sure you discuss any questions you have with your health care provider. Document Released: 09/10/2004 Document Revised: 05/15/2016 Document Reviewed: 01/03/2014 Elsevier Interactive Patient Education  2017 [employer].     Visit summary with medication list and pertinent instructions was printed for patient to review. All questions at time of visit were answered - patient instructed to contact office with any additional concerns.  ER/RTC precautions were reviewed with the patient. Follow-up plan: Return in about 3 months (around 09/05/2017) for recheck diabetes .   Total time spent 25 minutes, greater than 50% of the visit was face to face counseling and coordinating care for diagnosis of The primary encounter diagnosis was Diabetes mellitus without complication ([place]). Diagnoses of Essential hypertension, Hyperlipidemia associated with type 2 diabetes mellitus ([place]), and Type 2 diabetes mellitus with hyperglycemia, without long-term current use of insulin ([place]) were also pertinent to this visit. .   Note: Patient had some questions about this visit billing from February. Concerned that this wasn't billed as a physical. I explained that he was a new patient, we addressed several medical  problems at that visit so I coded that visit appropriately in my opinion. Any additional questions, can contact billing Scientist, research (physical sciences)

## 2017-06-12 MED FILL — JANUVIA 100 MG TABLET: 100 | 90 days supply | Qty: 90 | Fill #0

## 2017-06-17 MED FILL — SIMVASTATIN 40 MG TABLET: 40 | 90 days supply | Qty: 90 | Fill #0

## 2017-06-17 MED FILL — METFORMIN HCL ER 500 MG TAB: 500 | 30 days supply | Qty: 120 | Fill #0

## 2017-07-23 MED FILL — METFORMIN HCL ER 500 MG TAB: 500 | 30 days supply | Qty: 120 | Fill #0

## 2017-07-23 MED FILL — LOSARTAN POTASSIUM 25 MG TA: 25 | 90 days supply | Qty: 90 | Fill #0

## 2017-08-05 ENCOUNTER — Encounter: Payer: 59 | Admitting: Osteopathic Medicine

## 2017-08-13 ENCOUNTER — Encounter: Payer: 59 | Admitting: Osteopathic Medicine

## 2017-08-27 MED FILL — METFORMIN HCL ER 500 MG TAB: 500 | 90 days supply | Qty: 360 | Fill #1

## 2017-08-28 MED FILL — ACCU-CHEK GUIDE TEST STRIP: 90 days supply | Qty: 100 | Fill #0

## 2017-08-28 MED FILL — ACCU-CHEK FASTCLIX LANCETS: 90 days supply | Qty: 102 | Fill #0

## 2017-08-28 MED FILL — SIMVASTATIN 40 MG TABLET: 40 | 90 days supply | Qty: 90 | Fill #1

## 2017-08-29 ENCOUNTER — Other Ambulatory Visit: Payer: Self-pay

## 2017-08-29 DIAGNOSIS — E1165 Type 2 diabetes mellitus with hyperglycemia: Secondary | ICD-10-CM

## 2017-08-29 MED ORDER — BLOOD GLUCOSE MONITOR KIT: PACK | 99 refills | Status: DC

## 2017-09-08 MED FILL — JANUVIA 100 MG TABLET: 100 | 90 days supply | Qty: 90 | Fill #1

## 2017-11-03 ENCOUNTER — Other Ambulatory Visit: Payer: Self-pay | Admitting: Osteopathic Medicine

## 2017-11-03 DIAGNOSIS — I10 Essential (primary) hypertension: Secondary | ICD-10-CM

## 2017-11-03 DIAGNOSIS — E1165 Type 2 diabetes mellitus with hyperglycemia: Secondary | ICD-10-CM

## 2017-11-03 MED ORDER — LOSARTAN POTASSIUM 25 MG PO TABS: 25.0000 mg | ORAL_TABLET | Freq: Every day | ORAL | 0 refills | Status: DC

## 2017-11-03 MED FILL — LOSARTAN POTASSIUM 25 MG TA: 25 | 90 days supply | Qty: 90 | Fill #0

## 2017-11-05 ENCOUNTER — Encounter: Payer: Self-pay | Admitting: Physician Assistant

## 2017-11-05 ENCOUNTER — Ambulatory Visit (INDEPENDENT_AMBULATORY_CARE_PROVIDER_SITE_OTHER): Payer: 59 | Admitting: Physician Assistant

## 2017-11-05 VITALS — BP 126/76 | HR 111 | Temp 98.0°F | Ht 66.0 in | Wt 176.0 lb

## 2017-11-05 DIAGNOSIS — J029 Acute pharyngitis, unspecified: Secondary | ICD-10-CM | POA: Diagnosis not present

## 2017-11-05 DIAGNOSIS — R059 Cough, unspecified: Secondary | ICD-10-CM

## 2017-11-05 DIAGNOSIS — J069 Acute upper respiratory infection, unspecified: Secondary | ICD-10-CM

## 2017-11-05 DIAGNOSIS — R05 Cough: Secondary | ICD-10-CM

## 2017-11-05 DIAGNOSIS — E1165 Type 2 diabetes mellitus with hyperglycemia: Secondary | ICD-10-CM | POA: Diagnosis not present

## 2017-11-05 LAB — POCT INFLUENZA A/B
INFLUENZA A, POC: NEGATIVE
INFLUENZA B, POC: NEGATIVE

## 2017-11-05 MED ORDER — SITAGLIPTIN PHOSPHATE 100 MG PO TABS: 100.0000 mg | ORAL_TABLET | Freq: Every day | ORAL | 0 refills | Status: DC

## 2017-11-05 MED ORDER — IPRATROPIUM BROMIDE 0.06 % NA SOLN: 2.0000 | Freq: Four times a day (QID) | NASAL | 1 refills | Status: DC

## 2017-11-05 MED ORDER — BENZONATATE 200 MG PO CAPS: 200.0000 mg | ORAL_CAPSULE | Freq: Two times a day (BID) | ORAL | 0 refills | Status: DC | PRN

## 2017-11-05 MED FILL — IPRATROPIUM 0.06% SPRAY: 0.06 | 30 days supply | Qty: 15 | Fill #0

## 2017-11-05 MED FILL — BENZONATATE 200 MG CAPSULE: 200 | 10 days supply | Qty: 20 | Fill #0

## 2017-11-05 NOTE — Patient Instructions (Signed)
Upper Respiratory Infection, Adult Most upper respiratory infections (URIs) are caused by a virus. A URI affects the nose, throat, and upper air passages. The most common type of URI is often called "the common cold." Follow these instructions at home:  Take medicines only as told by your doctor.  Gargle warm saltwater or take cough drops to comfort your throat as told by your doctor.  Use a warm mist humidifier or inhale steam from a shower to increase air moisture. This may make it easier to breathe.  Drink enough fluid to keep your pee (urine) clear or pale yellow.  Eat soups and other clear broths.  Have a healthy diet.  Rest as needed.  Go back to work when your fever is gone or your doctor says it is okay. ? You may need to stay home longer to avoid giving your URI to others. ? You can also wear a face mask and wash your hands often to prevent spread of the virus.  Use your inhaler more if you have asthma.  Do not use any tobacco products, including cigarettes, chewing tobacco, or electronic cigarettes. If you need help quitting, ask your doctor. Contact a doctor if:  You are getting worse, not better.  Your symptoms are not helped by medicine.  You have chills.  You are getting more short of breath.  You have brown or red mucus.  You have yellow or brown discharge from your nose.  You have pain in your face, especially when you bend forward.  You have a fever.  You have puffy (swollen) neck glands.  You have pain while swallowing.  You have white areas in the back of your throat. Get help right away if:  You have very bad or constant: ? Headache. ? Ear pain. ? Pain in your forehead, behind your eyes, and over your cheekbones (sinus pain). ? Chest pain.  You have long-lasting (chronic) lung disease and any of the following: ? Wheezing. ? Long-lasting cough. ? Coughing up blood. ? A change in your usual mucus.  You have a stiff neck.  You have  changes in your: ? Vision. ? Hearing. ? Thinking. ? Mood. This information is not intended to replace advice given to you by your health care provider. Make sure you discuss any questions you have with your health care provider. Document Released: 02/05/2008 Document Revised: 04/21/2016 Document Reviewed: 11/24/2013 Elsevier Interactive Patient Education  2018 Elsevier Inc.  

## 2017-11-05 NOTE — Progress Notes (Signed)
Subjective:    Patient ID: Johnathan Warner, male    DOB: 08/08/1966, 52 y.o.   MRN: [id number]  HPI  Patient is a 52 year old male with hypertension, diabetes type 2, hyperlipidemia who presents to the clinic with 1 day of sore throat, cough, sinus congestion and no energy.  He denies any body aches, chills, headache or fever.  He has taken Sudafed and ibuprofen with minimal relief.  He denies any use of nasal sprays.  He requests to be tested for the flu.  He does not have any flu exposure.  Pt request DM medication januvia as he is out of it. He has not been seen by PCP since October.   .. Active Ambulatory Problems    Diagnosis Date Noted  . Type 2 diabetes mellitus with hyperglycemia, without long-term current use of insulin (HCC) 10/23/2016  . Essential hypertension 10/23/2016  . Hyperlipidemia associated with type 2 diabetes mellitus (HCC) 10/23/2016   Resolved Ambulatory Problems    Diagnosis Date Noted  . HYPERLIPIDEMIA 02/14/2011  . ACUT SUPPRATV OTITIS MEDIA W/O SPONT RUP EARDRUM 02/14/2011  . HYPERTENSION 02/14/2011  . Acute maxillary sinusitis 02/14/2011  . Acute pharyngitis 02/14/2011   Past Medical History:  Diagnosis Date  . Diabetes mellitus without complication (HCC)   . Hyperlipemia   . Hypertension      Review of Systems  All other systems reviewed and are negative.      Objective:   Physical Exam  Constitutional: He appears well-developed and well-nourished.  HENT:  Head: Normocephalic and atraumatic.  Right Ear: External ear normal.  Left Ear: External ear normal.  Nose: Nose normal.  Mouth/Throat: Oropharyngeal exudate present.  TM's clear bilaterally.  Negative for any sinus tenderness to palpation.  Nasal turbinates are red and swollen.   Eyes: Conjunctivae are normal.  Neck: Normal range of motion. Neck supple.  Cardiovascular: Normal rate, regular rhythm and normal heart sounds.  Pulmonary/Chest: Effort normal and breath sounds normal.   Lymphadenopathy:    He has no cervical adenopathy.          Assessment & Plan:  Marland Kitchen.Marland Kitchen.Geovany was seen today for cough, headache and sore throat.  Diagnoses and all orders for this visit:  URI with cough and congestion -     benzonatate (TESSALON) 200 MG capsule; Take 1 capsule (200 mg total) by mouth 2 (two) times daily as needed for cough. -     ipratropium (ATROVENT) 0.06 % nasal spray; Place 2 sprays into both nostrils 4 (four) times daily.  Type 2 diabetes mellitus with hyperglycemia, without long-term current use of insulin (HCC) Comments: Discussed A1c goals and triple therapy with patient, lifestyle changes,  he may end up on insulin He does not want injectables/insulin at this time. Orders: -     sitaGLIPtin (JANUVIA) 100 MG tablet; Take 1 tablet (100 mg total) by mouth daily.  Cough -     benzonatate (TESSALON) 200 MG capsule; Take 1 capsule (200 mg total) by mouth 2 (two) times daily as needed for cough. -     ipratropium (ATROVENT) 0.06 % nasal spray; Place 2 sprays into both nostrils 4 (four) times daily. -     POCT Influenza A/B  Sore throat -     benzonatate (TESSALON) 200 MG capsule; Take 1 capsule (200 mg total) by mouth 2 (two) times daily as needed for cough. -     ipratropium (ATROVENT) 0.06 % nasal spray; Place 2 sprays into both nostrils 4 (four) times daily. -  POCT Influenza A/B   .Marland Kitchen Results for orders placed or performed in visit on 11/05/17  POCT Influenza A/B  Result Value Ref Range   Influenza A, POC Negative Negative   Influenza B, POC Negative Negative   Reassured patient that symptoms appear viral. Given atrovent and tessalon. Continue sudafed and mucinex. Rest and hydrate. Consider humidifier. followup as needed.   I did give 30 days of januvia with instructions to follow up with PCP for future refills. He needs DM appt.

## 2017-11-18 ENCOUNTER — Telehealth: Payer: Self-pay | Admitting: Osteopathic Medicine

## 2017-11-18 MED ORDER — OSELTAMIVIR PHOSPHATE 75 MG PO CAPS
75.0000 mg | ORAL_CAPSULE | Freq: Every day | ORAL | 0 refills | Status: DC
Start: 2017-11-18 — End: 2017-12-18

## 2017-11-18 MED FILL — OSELTAMIVIR PHOSPHATE 75 MG: 75 | 10 days supply | Qty: 10 | Fill #0

## 2017-11-18 NOTE — Telephone Encounter (Signed)
Pt's brother tested positive for Flu A. Currently asymptomatic. Requesting Tamiflu. Preferred pharmacy - [facility]. 

## 2017-11-18 NOTE — Telephone Encounter (Signed)
Per PCP verbal, OK to send. Advised if symptoms begin, to come in for office visit.  

## 2017-12-02 MED FILL — JANUVIA 100 MG TABLET: 100 | 30 days supply | Qty: 30 | Fill #0

## 2017-12-02 MED FILL — METFORMIN HCL ER 500 MG TAB: 500 | 60 days supply | Qty: 240 | Fill #2

## 2017-12-05 ENCOUNTER — Telehealth: Payer: Self-pay

## 2017-12-05 ENCOUNTER — Other Ambulatory Visit: Payer: Self-pay

## 2017-12-05 DIAGNOSIS — E1165 Type 2 diabetes mellitus with hyperglycemia: Secondary | ICD-10-CM

## 2017-12-05 MED ORDER — SITAGLIPTIN PHOSPHATE 100 MG PO TABS: 100.0000 mg | ORAL_TABLET | Freq: Every day | ORAL | 0 refills | Status: DC

## 2017-12-05 MED ORDER — METFORMIN HCL ER 500 MG PO TB24: 1000.0000 mg | ORAL_TABLET | Freq: Two times a day (BID) | ORAL | 0 refills | Status: DC

## 2017-12-05 NOTE — Telephone Encounter (Signed)
Pharmacy requesting 90 d/s med refill for metformin and januvia. [place], no estimated time of return given. Medication e'Rxed to pharmacy as #90, 0 refills & pharmacy notified that pt must keep upcoming appt with PCP on 12/18/17 for any refills on medication.

## 2017-12-15 ENCOUNTER — Ambulatory Visit: Payer: 59 | Admitting: Osteopathic Medicine

## 2017-12-18 ENCOUNTER — Other Ambulatory Visit: Payer: Self-pay | Admitting: Osteopathic Medicine

## 2017-12-18 ENCOUNTER — Ambulatory Visit (INDEPENDENT_AMBULATORY_CARE_PROVIDER_SITE_OTHER): Payer: 59 | Admitting: Osteopathic Medicine

## 2017-12-18 ENCOUNTER — Encounter: Payer: Self-pay | Admitting: Osteopathic Medicine

## 2017-12-18 VITALS — BP 139/90 | HR 84 | Temp 97.8°F | Wt 179.7 lb

## 2017-12-18 DIAGNOSIS — I10 Essential (primary) hypertension: Secondary | ICD-10-CM

## 2017-12-18 DIAGNOSIS — E785 Hyperlipidemia, unspecified: Secondary | ICD-10-CM | POA: Diagnosis not present

## 2017-12-18 DIAGNOSIS — R05 Cough: Secondary | ICD-10-CM

## 2017-12-18 DIAGNOSIS — E1165 Type 2 diabetes mellitus with hyperglycemia: Secondary | ICD-10-CM

## 2017-12-18 DIAGNOSIS — E1169 Type 2 diabetes mellitus with other specified complication: Secondary | ICD-10-CM | POA: Diagnosis not present

## 2017-12-18 DIAGNOSIS — R058 Other specified cough: Secondary | ICD-10-CM

## 2017-12-18 LAB — BASIC METABOLIC PANEL WITH GFR
BUN: 17 mg/dL (ref 7–25)
CO2: 26 mmol/L (ref 20–32)
CREATININE: 0.94 mg/dL (ref 0.70–1.33)
Calcium: 9.4 mg/dL (ref 8.6–10.3)
Chloride: 101 mmol/L (ref 98–110)
GFR, EST NON AFRICAN AMERICAN: 93 mL/min/{1.73_m2} (ref >=60)
GFR, Est African American: 108 mL/min/{1.73_m2} (ref >=60)
Glucose, Bld: 251 mg/dL — ABNORMAL HIGH (ref 65–99)
Potassium: 4.3 mmol/L (ref 3.5–5.3)
SODIUM: 136 mmol/L (ref 135–146)

## 2017-12-18 LAB — POCT GLYCOSYLATED HEMOGLOBIN (HGB A1C): Hemoglobin A1C: 8.3

## 2017-12-18 MED ORDER — BENZONATATE 200 MG PO CAPS: 200.0000 mg | ORAL_CAPSULE | Freq: Three times a day (TID) | ORAL | 0 refills | Status: DC | PRN

## 2017-12-18 MED ORDER — FLUTICASONE-SALMETEROL 115-21 MCG/ACT IN AERO: 2.0000 | INHALATION_SPRAY | Freq: Two times a day (BID) | RESPIRATORY_TRACT | 0 refills | Status: DC

## 2017-12-18 MED FILL — BENZONATATE 200 MG CAPSULE: 200 | 10 days supply | Qty: 30 | Fill #0

## 2017-12-18 MED FILL — SIMVASTATIN 40 MG TABLET: 40 | 90 days supply | Qty: 90 | Fill #0

## 2017-12-18 NOTE — Patient Instructions (Addendum)
Cough medicine and inhalers If cough no better in 2-3 weeks, come see us!  Try OTC Hydrocortisone for the rash  Avoid carbs! Increase activity! Recheck sugars in 3 months If a1C not better, we may need to add another medicine

## 2017-12-18 NOTE — Progress Notes (Signed)
HPI: Johnathan Warner is a 52 y.o. male  who presents to [facility] today, 12/18/17,  for chief complaint of:  DM2 check up    Patient is overdue for diabetes follow-up. Last seen here in October, did not follow up as directed in January to monitor A1C. Patient states that he has made some adjustments to diet, has not been working out as much as he could be but plans to start an exercise regimen.  DIABETES SCREENING/PREVENTIVE CARE: Updated 12/18/17  A1C past 3-6 mos: Yes  controlled? No!  01/19/16 8.3%   10/23/16: 10.4% - initiated triple therapy: Jardiance 25 daily, Januvia 100 daily, metformin XR 1000 daily  06/05/17: 7.7% yay!   Today 12/18/17: 8.3% BP goal <130/80:  LDL goal <70-100: await labs Eye exam annually: No , importance discussed with patient Foot exam: No  Microalbuminuria: n/a Metformin: Yes  ACE/ARB: Yes  Antiplatelet if ASCVD Risk >10%: Yes  Statin: Yes  Pneumovax: No - declined  Hypertension: Well-controlled on current medications. No home blood pressures to report. He says on home monitor these are up and down a bit, we do not have verified blood pressure cuff. No CP/SOB, no HA/VC   Allergy/Cough issues: associated with cough. Going on for 4 weeks after a cold. Robitussin. Other medications tried.   Rash: dry scaly spot on R wrist been there a few months. Doesn't really bother him.     Past medical, surgical, social and family history reviewed: Patient Active Problem List   Diagnosis Date Noted  . Type 2 diabetes mellitus with hyperglycemia, without long-term current use of insulin ([place]) 10/23/2016  . Essential hypertension 10/23/2016  . Hyperlipidemia associated with type 2 diabetes mellitus ([place]) 10/23/2016   No past surgical history on file. Social History   Tobacco Use  . Smoking status: Former Smoker    Last attempt to quit: 1994    Years since quitting: 25.3  . Smokeless tobacco: Never Used  Substance Use  Topics  . Alcohol use: No   No family history on file.   Current medication list and allergy/intolerance information reviewed:   Current Outpatient Medications  Medication Sig Dispense Refill  . aspirin EC 81 MG tablet Take 1 tablet (81 mg total) by mouth daily. 90 tablet 3  . blood glucose meter kit and supplies KIT Dispense based on patient and insurance preference. Use daily as directed. Please include lancets #100 refill 99, test strips #100 refill 99, control solution. Dx: E11.65 1 each PRN  . empagliflozin (JARDIANCE) 25 MG TABS tablet Take 25 mg by mouth daily. 90 tablet 1  . glucose blood (FREESTYLE TEST STRIPS) test strip Once daily    . ipratropium (ATROVENT) 0.06 % nasal spray Place 2 sprays into both nostrils 4 (four) times daily. 15 mL 1  . Lancets (FREESTYLE) lancets Use as instructed 100 each 99  . losartan (COZAAR) 25 MG tablet Take 1 tablet (25 mg total) by mouth daily. 90 tablet 0  . metFORMIN (GLUCOPHAGE-XR) 500 MG 24 hr tablet Take 2 tablets (1,000 mg total) by mouth 2 (two) times daily. Pt must keep appt on 12/18/17 w/pcp for refills 180 tablet 0  . simvastatin (ZOCOR) 40 MG tablet Take 1 tablet (40 mg total) by mouth daily. 90 tablet 3  . sitaGLIPtin (JANUVIA) 100 MG tablet Take 1 tablet (100 mg total) by mouth daily. Pt must keep appt on 12/18/17 w/pcp for refills 90 tablet 0   No current facility-administered medications for this  visit.    Allergies  Allergen Reactions  . Sulfonamide Derivatives       Review of Systems:  Constitutional:  No  fever, no chills, No recent illness,  HEENT: No  headache, no vision change  Cardiac: No  chest pain, No  pressure, No palpitation  Respiratory:  No  shortness of breath.  Endocrine: No cold intolerance,  No heat intolerance. No polyuria/polydipsia/polyphagia   Skin: Rash on R wrist   Neurologic: No  weakness, No  dizziness   Exam:  BP 139/90 (BP Location: Left Arm, Patient Position: Sitting, Cuff Size: Normal)    Pulse 84   Temp 97.8 F (36.6 C) (Oral)   Wt 179 lb 11.2 oz (81.5 kg)   BMI 29.00 kg/m   Constitutional: VS see above. General Appearance: alert, well-developed, well-nourished, NAD  Eyes: Normal lids and conjunctive, non-icteric sclera  Ears, Nose, Mouth, Throat: MMM, Normal external inspection ears/nares/mouth/lips/gums.  Neck: No masses, trachea midline. No thyroid enlargement. No tenderness/mass appreciated. No lymphadenopathy  Respiratory: Normal respiratory effort. no wheeze, no rhonchi, no rales  Cardiovascular: S1/S2 normal, no murmur, no rub/gallop auscultated. RRR. No lower extremity edema.   Musculoskeletal: Gait normal. No clubbing/cyanosis of digits.   Neurological: Normal balance/coordination. No tremor.   Skin: warm, dry, intact. Dry scaly rash on R dorsum wrist   Psychiatric: Normal judgment/insight. Normal mood and affect. Oriented x3.    Results for orders placed or performed in visit on 12/18/17 (from the past 72 hour(s))  POCT HgB A1C     Status: None   Collection Time: 12/18/17  7:30 AM  Result Value Ref Range   Hemoglobin A1C 8.3       ASSESSMENT/PLAN:   Type 2 diabetes mellitus with hyperglycemia, without long-term current use of insulin ([place]) - He states wife is worried about his kidney function and he'd like this checked today. Stressed diet/exercise changes or will need more meds/insulin  - Plan: POCT HgB F1Q, BASIC METABOLIC PANEL WITH GFR  Essential hypertension - Plan: BASIC METABOLIC PANEL WITH GFR  Hyperlipidemia associated with type 2 diabetes mellitus (HCC) - Lipids good at last check   Post-viral cough syndrome - Plan: fluticasone-salmeterol (ADVAIR HFA) 115-21 MCG/ACT inhaler, benzonatate (TESSALON) 200 MG capsule    Patient Instructions  Cough medicine and inhalers If cough no better in 2-3 weeks, come see [place]!  Try OTC Hydrocortisone for the rash  Avoid carbs! Increase activity! Recheck sugars in 3 months If a1C not better,  we may need to add another medicine      Visit summary with medication list and pertinent instructions was printed for patient to review. All questions at time of visit were answered - patient instructed to contact office with any additional concerns. ER/RTC precautions were reviewed with the patient. Follow-up plan: Return in about 3 months (around 03/19/2018) for monitor A1C for diabetes, see [place] sooner if needed!.   Total time spent 25 minutes, greater than 50% of the visit was face to face counseling and coordinating care for diagnosis of The primary encounter diagnosis was Type 2 diabetes mellitus with hyperglycemia, without long-term current use of insulin ([place]). Diagnoses of Essential hypertension, Hyperlipidemia associated with type 2 diabetes mellitus ([place]), and Post-viral cough syndrome were also pertinent to this visit. .   Note: Patient had some questions about this visit billing from February. Concerned that this wasn't billed as a physical. I explained that he was a new patient, we addressed several medical problems at that visit so I  coded that visit appropriately in my opinion. Any additional questions, can contact billing Scientist, research (physical sciences)

## 2018-01-05 MED FILL — JANUVIA 100 MG TABLET: 100 | 90 days supply | Qty: 90 | Fill #0

## 2018-01-29 ENCOUNTER — Telehealth: Payer: Self-pay | Admitting: Osteopathic Medicine

## 2018-01-29 ENCOUNTER — Other Ambulatory Visit: Payer: Self-pay | Admitting: Osteopathic Medicine

## 2018-01-29 DIAGNOSIS — E1165 Type 2 diabetes mellitus with hyperglycemia: Secondary | ICD-10-CM

## 2018-01-29 DIAGNOSIS — I10 Essential (primary) hypertension: Secondary | ICD-10-CM

## 2018-01-29 MED ORDER — METFORMIN HCL ER 500 MG PO TB24: 1000.0000 mg | ORAL_TABLET | Freq: Two times a day (BID) | ORAL | 0 refills | Status: DC

## 2018-01-29 MED ORDER — SITAGLIPTIN PHOSPHATE 100 MG PO TABS: 100.0000 mg | ORAL_TABLET | Freq: Every day | ORAL | 0 refills | Status: DC

## 2018-01-29 MED FILL — LOSARTAN POTASSIUM 25 MG TA: 25 | 90 days supply | Qty: 90 | Fill #0

## 2018-01-29 MED FILL — METFORMIN HCL ER 500 MG TAB: 500 | 90 days supply | Qty: 360 | Fill #0

## 2018-01-29 NOTE — Telephone Encounter (Signed)
Medication refilled

## 2018-01-30 NOTE — Telephone Encounter (Signed)
Task was completed on 01/29/18. Left a detailed vm msg for pt re: med RFs.

## 2018-03-20 ENCOUNTER — Ambulatory Visit: Payer: 59 | Admitting: Osteopathic Medicine

## 2018-04-03 ENCOUNTER — Ambulatory Visit: Payer: 59 | Admitting: Osteopathic Medicine

## 2018-04-06 MED FILL — JANUVIA 100 MG TABLET: 100 | 90 days supply | Qty: 90 | Fill #0

## 2018-04-06 MED FILL — SIMVASTATIN 40 MG TABLET: 40 | 90 days supply | Qty: 90 | Fill #1

## 2018-04-14 ENCOUNTER — Emergency Department (INDEPENDENT_AMBULATORY_CARE_PROVIDER_SITE_OTHER)
Admission: EM | Admit: 2018-04-14 | Discharge: 2018-04-14 | Disposition: A | Discharge status: Home / Self Care | Payer: 59 | Source: Home / Self Care | Attending: Family Medicine | Admitting: Family Medicine

## 2018-04-14 ENCOUNTER — Other Ambulatory Visit: Payer: Self-pay

## 2018-04-14 DIAGNOSIS — H6691 Otitis media, unspecified, right ear: Secondary | ICD-10-CM | POA: Diagnosis not present

## 2018-04-14 DIAGNOSIS — L219 Seborrheic dermatitis, unspecified: Secondary | ICD-10-CM

## 2018-04-14 MED ORDER — CICLOPIROX 1 % EX SHAM: MEDICATED_SHAMPOO | CUTANEOUS | 1 refills | Status: DC

## 2018-04-14 MED ORDER — AMOXICILLIN-POT CLAVULANATE 875-125 MG PO TABS: 1.0000 | ORAL_TABLET | Freq: Two times a day (BID) | ORAL | 0 refills | Status: DC

## 2018-04-14 NOTE — ED Provider Notes (Signed)
[name]    CSN: [id number] Arrival date & time: 04/14/18  1924     History   Chief Complaint Chief Complaint  Patient presents with  . Otalgia    RT    HPI Johnathan Warner is a 52 y.o. male.   Patient presents with two complaints:   1)  He has had a right earache for about one week with purulent discharge.  No URI symptoms or sinus congestion. 2)  He complains of dandruff and has had no improvement after using OTC shampoo such as Head & Shoulders.  The history is provided by the patient.    Past Medical History:  Diagnosis Date  . Diabetes mellitus without complication ([place])   . Hyperlipemia   . Hypertension     Patient Active Problem List   Diagnosis Date Noted  . Type 2 diabetes mellitus with hyperglycemia, without long-term current use of insulin ([place]) 10/23/2016  . Essential hypertension 10/23/2016  . Hyperlipidemia associated with type 2 diabetes mellitus ([place]) 10/23/2016    History reviewed. No pertinent surgical history.     Home Medications    Prior to Admission medications   Medication Sig Start Date End Date Taking? Authorizing Provider  amoxicillin-clavulanate (AUGMENTIN) 875-125 MG tablet Take 1 tablet by mouth 2 (two) times daily. Take with food 04/14/18   Kandra Nicolas, MD  aspirin EC 81 MG tablet Take 1 tablet (81 mg total) by mouth daily. 06/05/17   Emeterio Reeve, DO  benzonatate (TESSALON) 200 MG capsule Take 1 capsule (200 mg total) by mouth 3 (three) times daily as needed for cough. 12/18/17   Emeterio Reeve, DO  blood glucose meter kit and supplies KIT Dispense based on patient and insurance preference. Use daily as directed. Please include lancets #100 refill 99, test strips #100 refill 99, control solution. Dx: E11.65 08/29/17   Emeterio Reeve, DO  Ciclopirox 1 % shampoo Shampoo scalp 2 times per week for 4 weeks.  Leave on for 3 minutes then rinse off. 04/14/18   Kandra Nicolas, MD  empagliflozin (JARDIANCE) 25  MG TABS tablet Take 25 mg by mouth daily. 06/05/17   Emeterio Reeve, DO  fluticasone-salmeterol (ADVAIR HFA) 2010389874 MCG/ACT inhaler Inhale 2 puffs into the lungs 2 (two) times daily. 12/18/17   Emeterio Reeve, DO  glucose blood (FREESTYLE TEST STRIPS) test strip Once daily 01/19/16   [provider]  ipratropium (ATROVENT) 0.06 % nasal spray Place 2 sprays into both nostrils 4 (four) times daily. Patient not taking: Reported on 12/18/2017 11/05/17   Donella Stade, PA-C  Lancets (FREESTYLE) lancets Use as instructed 10/23/16   Emeterio Reeve, DO  losartan (COZAAR) 25 MG tablet TAKE 1 TABLET (25 MG TOTAL) BY MOUTH DAILY. 01/29/18   Emeterio Reeve, DO  metFORMIN (GLUCOPHAGE-XR) 500 MG 24 hr tablet Take 2 tablets (1,000 mg total) by mouth 2 (two) times daily. Pt must keep appt on 12/18/17 w/pcp for refills 01/29/18   Emeterio Reeve, DO  simvastatin (ZOCOR) 40 MG tablet TAKE 1 TABLET (40 MG TOTAL) BY MOUTH DAILY. 12/18/17   Emeterio Reeve, DO  sitaGLIPtin (JANUVIA) 100 MG tablet Take 1 tablet (100 mg total) by mouth daily. Pt must keep appt on 12/18/17 w/pcp for refills 01/29/18   Emeterio Reeve, DO  lisinopril (PRINIVIL,ZESTRIL) 10 MG tablet Take 10 mg by mouth daily.  12/30/15  [provider]    Family History Family History  Family history unknown: Yes    Social History Social History  Tobacco Use  . Smoking status: Former Smoker    Last attempt to quit: 1994    Years since quitting: 25.6  . Smokeless tobacco: Never Used  Substance Use Topics  . Alcohol use: No  . Drug use: Not on file     Allergies   Sulfa antibiotics; Amlodipine; Atorvastatin; Celecoxib; Hydrochlorothiazide; Lisinopril; Simvastatin; Sulfonamide derivatives; and Tetanus toxoid, adsorbed   Review of Systems Review of Systems No sore throat No cough No pleuritic pain No wheezing + nasal congestion ? post-nasal drainage No sinus pain/pressure No itchy/red eyes + right  earache No hemoptysis No SOB No fever/chills No nausea No vomiting No abdominal pain No diarrhea No urinary symptoms No skin rash; scalp: fine, white, diffuse mild scaliness without underlying erythema No fatigue No myalgias No headache    Physical Exam Triage Vital Signs ED Triage Vitals  Enc Vitals Group     BP 04/14/18 2011 131/84     Pulse Rate 04/14/18 2011 91     Resp --      Temp 04/14/18 2011 98.6 F (37 C)     Temp Source 04/14/18 2011 Oral     SpO2 04/14/18 2011 96 %     Weight 04/14/18 2012 177 lb (80.3 kg)     Height 04/14/18 2012 5' 6" (1.676 m)     Head Circumference --      Peak Flow --      Pain Score 04/14/18 2012 6     Pain Loc --      Pain Edu? --      Excl. in GC? --    No data found.  Updated Vital Signs BP 131/84 (BP Location: Right Arm)   Pulse 91   Temp 98.6 F (37 C) (Oral)   Ht 5' 6" (1.676 m)   Wt 80.3 kg   SpO2 96%   BMI 28.57 kg/m   Visual Acuity Right Eye Distance:   Left Eye Distance:   Bilateral Distance:    Right Eye Near:   Left Eye Near:    Bilateral Near:     Physical Exam Nursing notes and Vital Signs reviewed. Appearance:  Patient appears stated age, and in no acute distress Eyes:  Pupils are equal, round, and reactive to light and accomodation.  Extraocular movement is intact.  Conjunctivae are not inflamed  Ears:  Right canal occluded proximally with purulent discharge; unable to visualize right tympanic membrane.  Left canal and tympanic membrane appear normal, although there may be some minimal serous effusion left tympanic membrane. Nose:  Congested turbinates.  No sinus tenderness.   Pharynx:  Normal Neck:  Supple.  No adenopathy  Lungs:   Normal respiration Heart:   Normal rate  Extremities:  No edema.  Skin:  No rash present.  Scalp:  Dandruff present without plaques or lesions.  UC Treatments / Results  Labs (all labs ordered are listed, but only abnormal results are displayed) Labs Reviewed - No  data to display  EKG None  Radiology No results found.  Procedures Procedures (including critical care time)  Medications Ordered in UC Medications - No data to display  Initial Impression / Assessment and Plan / UC Course  I have reviewed the triage vital signs and the nursing notes.  Pertinent labs & imaging results that were available during my care of the patient were reviewed by me and considered in my medical decision making (see chart for details).    ?perforation right tympanic membrane. Begin Augmentin for   10 days, then follow-up with PCP or ENT (check for residual perforation right tympanic membrane). Rx for ciclopirox 1% shampoo for dandruff   Final Clinical Impressions(s) / UC Diagnoses   Final diagnoses:  Acute right otitis media  Seborrheic dermatitis of scalp   Discharge Instructions   None    ED Prescriptions    Medication Sig Dispense Auth. Provider   amoxicillin-clavulanate (AUGMENTIN) 875-125 MG tablet Take 1 tablet by mouth 2 (two) times daily. Take with food 20 tablet Kandra Nicolas, MD   Ciclopirox 1 % shampoo Shampoo scalp 2 times per week for 4 weeks.  Leave on for 3 minutes then rinse off. 120 mL Kandra Nicolas, MD         Kandra Nicolas, MD 04/17/18 [phone]

## 2018-04-14 NOTE — ED Triage Notes (Signed)
Pt c/o ear pain. Wife checked ear and saw some pus. Concerned about ear infection.

## 2018-04-15 MED FILL — AMOX-CLAV 875-125 MG TABLET: 875-125 | 10 days supply | Qty: 20 | Fill #0

## 2018-04-15 MED FILL — CICLOPIROX 1% SHAMPOO: 1 | 30 days supply | Qty: 120 | Fill #0

## 2018-04-24 ENCOUNTER — Ambulatory Visit: Payer: 59 | Admitting: Osteopathic Medicine

## 2018-04-29 ENCOUNTER — Encounter: Payer: Self-pay | Admitting: Osteopathic Medicine

## 2018-04-29 ENCOUNTER — Ambulatory Visit (INDEPENDENT_AMBULATORY_CARE_PROVIDER_SITE_OTHER): Payer: 59 | Admitting: Osteopathic Medicine

## 2018-04-29 VITALS — BP 129/76 | HR 90 | Temp 97.9°F | Wt 176.3 lb

## 2018-04-29 DIAGNOSIS — I10 Essential (primary) hypertension: Secondary | ICD-10-CM | POA: Diagnosis not present

## 2018-04-29 DIAGNOSIS — E1165 Type 2 diabetes mellitus with hyperglycemia: Secondary | ICD-10-CM

## 2018-04-29 DIAGNOSIS — H60392 Other infective otitis externa, left ear: Secondary | ICD-10-CM | POA: Diagnosis not present

## 2018-04-29 LAB — POCT GLYCOSYLATED HEMOGLOBIN (HGB A1C): HEMOGLOBIN A1C: 7.4 % — AB (ref 4.0–5.6)

## 2018-04-29 MED ORDER — METFORMIN HCL ER 500 MG PO TB24: 1000.0000 mg | ORAL_TABLET | Freq: Two times a day (BID) | ORAL | 1 refills | Status: DC

## 2018-04-29 MED ORDER — EMPAGLIFLOZIN 25 MG PO TABS: 25.0000 mg | ORAL_TABLET | Freq: Every day | ORAL | 1 refills | Status: DC

## 2018-04-29 MED ORDER — CIPROFLOXACIN-DEXAMETHASONE 0.3-0.1 % OT SUSP: 4.0000 [drp] | Freq: Two times a day (BID) | OTIC | 0 refills | Status: AC

## 2018-04-29 MED ORDER — LOSARTAN POTASSIUM 25 MG PO TABS: 25.0000 mg | ORAL_TABLET | Freq: Every day | ORAL | 1 refills | Status: DC

## 2018-04-29 MED ORDER — SITAGLIPTIN PHOSPHATE 100 MG PO TABS: 100.0000 mg | ORAL_TABLET | Freq: Every day | ORAL | 1 refills | Status: DC

## 2018-04-29 MED FILL — LOSARTAN POTASSIUM 25 MG TA: 25 | 90 days supply | Qty: 90 | Fill #0

## 2018-04-29 MED FILL — CIPRODEX OTIC SUSPENSION: 0.3-0.1 | 18 days supply | Qty: 8 | Fill #0

## 2018-04-29 MED FILL — METFORMIN HCL ER 500 MG TAB: 500 | 90 days supply | Qty: 360 | Fill #0

## 2018-04-29 NOTE — Patient Instructions (Signed)
Plan: Ear drops, will see ENT if these don't fix the ear pain Watch diet! Keep up with exercise! A1C is looking better!

## 2018-04-29 NOTE — Progress Notes (Signed)
HPI: Johnathan Warner is a 52 y.o. male  who presents to [facility] today, 04/29/18,  for chief complaint of:  DM2 check up Ear concern      DIABETES SCREENING/PREVENTIVE CARE: Updated 04/29/18  A1C past 3-6 mos: Yes  controlled? No!  01/19/16 8.3%   10/23/16: 10.4% - initiated triple therapy: Jardiance 25 daily, Januvia 100 daily, metformin XR 1000 daily  06/05/17: 7.7% yay!   12/18/17: 8.3% discussed diet/exercise and if no better would need add medication.   Today, 04/29/18: 7.4%  BP goal <130/80:  LDL goal <70-100: LDL 66 Eye exam annually: None on file, importance discussed with patient Foot exam: No  Microalbuminuria: n/a Metformin: Yes  ACE/ARB: Yes  Antiplatelet if ASCVD Risk >10%: Yes  Statin: Yes  Pneumovax: No - declined   Other concern today - new: R ear has been hurting him. Requests referral for ENT. Has been swimming Got Augmentin for his at urgent care which has helped the pressure/discomfort but not totally resolved.   Hypertension: Well-controlled on current medications. No home blood pressures to report. We do not have verified blood pressure cuff though he states he is checking it at home and these numbers are fine No CP/SOB, no HA/VC     Results for orders placed or performed in visit on 04/29/18 (from the past 24 hour(s))  POCT HgB A1C     Status: Abnormal   Collection Time: 04/29/18  1:51 PM  Result Value Ref Range   Hemoglobin A1C 7.4 (A) 4.0 - 5.6 %   HbA1c POC (<> result, manual entry)     HbA1c, POC (prediabetic range)     HbA1c, POC (controlled diabetic range)       Past medical, surgical, social and family history reviewed: Patient Active Problem List   Diagnosis Date Noted  . Type 2 diabetes mellitus with hyperglycemia, without long-term current use of insulin ([place]) 10/23/2016  . Essential hypertension 10/23/2016  . Hyperlipidemia associated with type 2 diabetes mellitus ([place]) 10/23/2016   No past  surgical history on file.   Social History   Tobacco Use  . Smoking status: Former Smoker    Last attempt to quit: 1994    Years since quitting: 25.6  . Smokeless tobacco: Never Used  Substance Use Topics  . Alcohol use: No   Family History  Family history unknown: Yes     Current medication list and allergy/intolerance information reviewed:   Current Meds  Medication Sig  . amoxicillin-clavulanate (AUGMENTIN) 875-125 MG tablet Take 1 tablet by mouth 2 (two) times daily. Take with food  . aspirin EC 81 MG tablet Take 1 tablet (81 mg total) by mouth daily.  . blood glucose meter kit and supplies KIT Dispense based on patient and insurance preference. Use daily as directed. Please include lancets #100 refill 99, test strips #100 refill 99, control solution. Dx: E11.65  . Ciclopirox 1 % shampoo Shampoo scalp 2 times per week for 4 weeks.  Leave on for 3 minutes then rinse off.  . empagliflozin (JARDIANCE) 25 MG TABS tablet Take 25 mg by mouth daily.  Marland Kitchen glucose blood (FREESTYLE TEST STRIPS) test strip Once daily  . Lancets (FREESTYLE) lancets Use as instructed  . losartan (COZAAR) 25 MG tablet TAKE 1 TABLET (25 MG TOTAL) BY MOUTH DAILY.  . metFORMIN (GLUCOPHAGE-XR) 500 MG 24 hr tablet Take 2 tablets (1,000 mg total) by mouth 2 (two) times daily. Pt must keep appt on 12/18/17 w/pcp for refills  .  simvastatin (ZOCOR) 40 MG tablet TAKE 1 TABLET (40 MG TOTAL) BY MOUTH DAILY.  . sitaGLIPtin (JANUVIA) 100 MG tablet Take 1 tablet (100 mg total) by mouth daily. Pt must keep appt on 12/18/17 w/pcp for refills    Allergies  Allergen Reactions  . Sulfa Antibiotics Other (See Comments)    Hot flashes photodermatitis   . Amlodipine Other (See Comments)    umknown  . Atorvastatin Other (See Comments)  . Celecoxib Other (See Comments)    photodermatitis  . Hydrochlorothiazide Other (See Comments)    photodermatitis  . Lisinopril Other (See Comments)    Cough  . Simvastatin Other (See  Comments)    Inc LFT's  . Sulfonamide Derivatives   . Tetanus Toxoid, Adsorbed Other (See Comments)    unknown      Review of Systems:  Constitutional:  No  fever, no chills, No recent illness,  HEENT: No  headache, no vision change, +ear pain as per HPI  Cardiac: No  chest pain, No  pressure, No palpitation  Respiratory:  No  shortness of breath.  Endocrine: No cold intolerance,  No heat intolerance. No polyuria/polydipsia/polyphagia   Skin: Rash on R wrist   Neurologic: No  weakness, No  dizziness   Exam:  BP 129/76 (BP Location: Left Arm, Patient Position: Sitting, Cuff Size: Normal)   Pulse 90   Temp 97.9 F (36.6 C) (Oral)   Wt 176 lb 4.8 oz (80 kg)   BMI 28.46 kg/m     Constitutional: VS see above. General Appearance: alert, well-developed, well-nourished, NAD  Eyes: Normal lids and conjunctive, non-icteric sclera  Ears, Nose, Mouth, Throat: MMM, Normal external inspection ears/nares/mouth/lips/gums. TM WNL on L, on R (+) canal irritation and effusion behind TM, no redness/erythema or bulging TM   Neck: No masses, trachea midline. No lymphadenopathy   Respiratory: Normal respiratory effort. no wheeze, no rhonchi, no rales  Cardiovascular: S1/S2 normal, no murmur, no rub/gallop auscultated. RRR. No lower extremity edema.   Musculoskeletal: Gait normal. No clubbing/cyanosis of digits.   Neurological: Normal balance/coordination. No tremor.   Skin: warm, dry, intact. Dry scaly rash on R dorsum wrist   Psychiatric: Normal judgment/insight. Normal mood and affect. Oriented x3.    No results found for this or any previous visit (from the past 72 hour(s)).    ASSESSMENT/PLAN: The primary encounter diagnosis was Type 2 diabetes mellitus with hyperglycemia, without long-term current use of insulin ([place]). Diagnoses of Type 2 diabetes mellitus with hyperglycemia, without long-term current use of insulin ([place]), Essential hypertension, and Other infective otitis  externa of left ear, unspecified chronicity were also pertinent to this visit.   Meds ordered this encounter  Medications  . sitaGLIPtin (JANUVIA) 100 MG tablet    Sig: Take 1 tablet (100 mg total) by mouth daily.    Dispense:  90 tablet    Refill:  1  . metFORMIN (GLUCOPHAGE-XR) 500 MG 24 hr tablet    Sig: Take 2 tablets (1,000 mg total) by mouth 2 (two) times daily.    Dispense:  360 tablet    Refill:  1  . losartan (COZAAR) 25 MG tablet    Sig: Take 1 tablet (25 mg total) by mouth daily.    Dispense:  90 tablet    Refill:  1  . empagliflozin (JARDIANCE) 25 MG TABS tablet    Sig: Take 25 mg by mouth daily.    Dispense:  90 tablet    Refill:  1  . ciprofloxacin-dexamethasone (CIPRODEX)  OTIC suspension    Sig: Place 4 drops into the left ear 2 (two) times daily for 7 days.    Dispense:  1 mL    Refill:  0   Orders Placed This Encounter  Procedures  . Ambulatory referral to ENT  . POCT HgB A1C       Patient Instructions  Plan: Ear drops, will see ENT if these don't fix the ear pain Watch diet! Keep up with exercise! A1C is looking better!     Visit summary with medication list and pertinent instructions was printed for patient to review. All questions at time of visit were answered - patient instructed to contact office with any additional concerns. ER/RTC precautions were reviewed with the patient. Follow-up plan: Return in about 3 months (around 07/30/2018) for [place] - sooner if needed .   Total time spent 25 minutes, greater than 50% of the visit was face to face counseling and coordinating care for diagnosis of The primary encounter diagnosis was Type 2 diabetes mellitus with hyperglycemia, without long-term current use of insulin ([place]). Diagnoses of Type 2 diabetes mellitus with hyperglycemia, without long-term current use of insulin ([place]), Essential hypertension, and Other infective otitis externa of left ear, unspecified chronicity were also  pertinent to this visit. .   Note: Patient had some questions about this visit billing from February. Concerned that this wasn't billed as a physical. I explained that he was a new patient, we addressed several medical problems at that visit so I coded that visit appropriately in my opinion. Any additional questions, can contact billing Scientist, research (physical sciences)

## 2018-05-22 DIAGNOSIS — Z8669 Personal history of other diseases of the nervous system and sense organs: Secondary | ICD-10-CM | POA: Diagnosis not present

## 2018-05-28 ENCOUNTER — Ambulatory Visit (INDEPENDENT_AMBULATORY_CARE_PROVIDER_SITE_OTHER): Payer: 59 | Admitting: Physician Assistant

## 2018-05-28 ENCOUNTER — Encounter: Payer: Self-pay | Admitting: Physician Assistant

## 2018-05-28 VITALS — BP 136/86 | HR 108 | Wt 179.0 lb

## 2018-05-28 DIAGNOSIS — E1169 Type 2 diabetes mellitus with other specified complication: Secondary | ICD-10-CM

## 2018-05-28 DIAGNOSIS — R000 Tachycardia, unspecified: Secondary | ICD-10-CM

## 2018-05-28 DIAGNOSIS — Z87898 Personal history of other specified conditions: Secondary | ICD-10-CM

## 2018-05-28 DIAGNOSIS — E785 Hyperlipidemia, unspecified: Secondary | ICD-10-CM

## 2018-05-28 DIAGNOSIS — I10 Essential (primary) hypertension: Secondary | ICD-10-CM

## 2018-05-28 DIAGNOSIS — E1159 Type 2 diabetes mellitus with other circulatory complications: Secondary | ICD-10-CM

## 2018-05-28 DIAGNOSIS — E1165 Type 2 diabetes mellitus with hyperglycemia: Secondary | ICD-10-CM

## 2018-05-28 MED ORDER — SIMVASTATIN 40 MG PO TABS: 40.0000 mg | ORAL_TABLET | Freq: Every day | ORAL | 3 refills | Status: DC

## 2018-05-28 MED ORDER — LOSARTAN POTASSIUM 50 MG PO TABS: 50.0000 mg | ORAL_TABLET | Freq: Every day | ORAL | 1 refills | Status: DC

## 2018-05-28 MED FILL — LOSARTAN POTASSIUM 50 MG TA: 50 | 90 days supply | Qty: 90 | Fill #0

## 2018-05-28 NOTE — Patient Instructions (Addendum)
If dizziness persists or recurs try to write down what you experiencing - for example, feels like the room is spinning, feels like I'm going to pass out. I would also recommend follow-up with your Ear Nose & Throat (ENT) doctor if this persists or recurs   Dizziness Dizziness is a common problem. It is a feeling of unsteadiness or light-headedness. You may feel like you are about to faint. Dizziness can lead to injury if you stumble or fall. Anyone can become dizzy, but dizziness is more common in older adults. This condition can be caused by a number of things, including medicines, dehydration, or illness. Follow these instructions at home: Eating and drinking  Drink enough fluid to keep your urine clear or pale yellow. This helps to keep you from becoming dehydrated. Try to drink more clear fluids, such as water.  Do not drink alcohol.  Limit your caffeine intake if told to do so by your health care provider. Check ingredients and nutrition facts to see if a food or beverage contains caffeine.  Limit your salt (sodium) intake if told to do so by your health care provider. Check ingredients and nutrition facts to see if a food or beverage contains sodium. Activity  Avoid making quick movements. ? Rise slowly from chairs and steady yourself until you feel okay. ? In the morning, first sit up on the side of the bed. When you feel okay, stand slowly while you hold onto something until you know that your balance is fine.  If you need to stand in one place for a long time, move your legs often. Tighten and relax the muscles in your legs while you are standing.  Do not drive or use heavy machinery if you feel dizzy.  Avoid bending down if you feel dizzy. Place items in your home so that they are easy for you to reach without leaning over. Lifestyle  Do not use any products that contain nicotine or tobacco, such as cigarettes and e-cigarettes. If you need help quitting, ask your health care  provider.  Try to reduce your stress level by using methods such as yoga or meditation. Talk with your health care provider if you need help to manage your stress. General instructions  Watch your dizziness for any changes.  Take over-the-counter and prescription medicines only as told by your health care provider. Talk with your health care provider if you think that your dizziness is caused by a medicine that you are taking.  Tell a friend or a family member that you are feeling dizzy. If he or she notices any changes in your behavior, have this person call your health care provider.  Keep all follow-up visits as told by your health care provider. This is important. Contact a health care provider if:  Your dizziness does not go away.  Your dizziness or light-headedness gets worse.  You feel nauseous.  You have reduced hearing.  You have new symptoms.  You are unsteady on your feet or you feel like the room is spinning. Get help right away if:  You vomit or have diarrhea and are unable to eat or drink anything.  You have problems talking, walking, swallowing, or using your arms, hands, or legs.  You feel generally weak.  You are not thinking clearly or you have trouble forming sentences. It may take a friend or family member to notice this.  You have chest pain, abdominal pain, shortness of breath, or sweating.  Your vision changes.  You  have any bleeding.  You have a severe headache.  You have neck pain or a stiff neck.  You have a fever. These symptoms may represent a serious problem that is an emergency. Do not wait to see if the symptoms will go away. Get medical help right away. Call your local emergency services (911 in the [place] Do not drive yourself to the hospital. Summary  Dizziness is a feeling of unsteadiness or light-headedness. This condition can be caused by a number of things, including medicines, dehydration, or illness.  Anyone can become dizzy,  but dizziness is more common in older adults.  Drink enough fluid to keep your urine clear or pale yellow. Do not drink alcohol.  Avoid making quick movements if you feel dizzy. Monitor your dizziness for any changes. This information is not intended to replace advice given to you by your health care provider. Make sure you discuss any questions you have with your health care provider. Document Released: 02/12/2001 Document Revised: 09/21/2016 Document Reviewed: 09/21/2016 Elsevier Interactive Patient Education  [employer].

## 2018-05-28 NOTE — Progress Notes (Signed)
HPI:                                                                Johnathan Warner is a 52 y.o. male who presents to [facility]: [facility] today for dizziness  52 yo M with PMH of HTN, uncontrolled Type 2 DM presents with episodic "dizziness." He states "I mostly feel it here in my forehead but there is no pain or pressure." Onset 2 weeks ago Duration intermittent, lasting 1-2 hours, feels normal in between No triggers or aggravating factors Resolved about 48 hours ago  Not worse with head movements Not associated with vision change or diplopia No headache, focal weakness No loss of balance, falls or gait disturbance No syncope Last month he states he had fluid in his right ear, which has resolved. He denies hearing change, pain, tinnitus. Denies chest pain or palpitations Reports home non-fasting blood glucose was 136   Past Medical History:  Diagnosis Date  . Diabetes mellitus without complication ([place])   . Hyperlipemia   . Hypertension    History reviewed. No pertinent surgical history. Social History   Tobacco Use  . Smoking status: Former Smoker    Last attempt to quit: 1994    Years since quitting: 25.7  . Smokeless tobacco: Never Used  Substance Use Topics  . Alcohol use: No   Family history is unknown by patient.    ROS: negative except as noted in the HPI  Medications: Current Outpatient Medications  Medication Sig Dispense Refill  . aspirin EC 81 MG tablet Take 1 tablet (81 mg total) by mouth daily. 90 tablet 3  . blood glucose meter kit and supplies KIT Dispense based on patient and insurance preference. Use daily as directed. Please include lancets #100 refill 99, test strips #100 refill 99, control solution. Dx: E11.65 1 each PRN  . Ciclopirox 1 % shampoo Shampoo scalp 2 times per week for 4 weeks.  Leave on for 3 minutes then rinse off. 120 mL 1  . empagliflozin (JARDIANCE) 25 MG TABS tablet Take 25 mg by mouth  daily. 90 tablet 1  . glucose blood (FREESTYLE TEST STRIPS) test strip Once daily    . Lancets (FREESTYLE) lancets Use as instructed 100 each 99  . losartan (COZAAR) 25 MG tablet Take 1 tablet (25 mg total) by mouth daily. 90 tablet 1  . metFORMIN (GLUCOPHAGE-XR) 500 MG 24 hr tablet Take 2 tablets (1,000 mg total) by mouth 2 (two) times daily. 360 tablet 1  . simvastatin (ZOCOR) 40 MG tablet TAKE 1 TABLET (40 MG TOTAL) BY MOUTH DAILY. 90 tablet 3  . sitaGLIPtin (JANUVIA) 100 MG tablet Take 1 tablet (100 mg total) by mouth daily. 90 tablet 1   No current facility-administered medications for this visit.    Allergies  Allergen Reactions  . Sulfa Antibiotics Other (See Comments)    Hot flashes photodermatitis   . Amlodipine Other (See Comments)    umknown  . Atorvastatin Other (See Comments)  . Celecoxib Other (See Comments)    photodermatitis  . Hydrochlorothiazide Other (See Comments)    photodermatitis  . Lisinopril Other (See Comments)    Cough  . Simvastatin Other (See Comments)    Inc LFT's  . Sulfamethoxazole  photodermatitis  . Sulfonamide Derivatives   . Tetanus Toxoid, Adsorbed Other (See Comments)    unknown        Objective:  BP 136/86   Pulse (!) 108   Wt 179 lb (81.2 kg)   BMI 28.89 kg/m  Gen: well-groomed, not ill-appearing, no acute distress HEENT: head normocephalic, atraumatic; conjunctiva and cornea clear, right TM appears dull and red, non-bulging, external ear canals without debris or edema, oropharynx clear, moist mucus membranes; neck supple, no meningeal signs Pulm: Normal work of breathing, normal phonation, clear to auscultation bilaterally CV: tachycardic rate, regular rhythm, s1 and s2 distinct, no murmurs, clicks or rubs Neuro:  cranial nerves II-XII intact, no nystagmus, normal finger-to-nose, normal heel-to-shin, negative pronator drift, normal rapid alternating movements, normal tone, no tremor MSK: strength 5/5 and symmetric in  bilateral upper and lower extremities, normal gait and station, negative Romberg Mental Status: alert and oriented x 3, speech articulate, and thought processes clear and goal-directed  ECG 05/28/2018 1:49 pm Vent Rate 103 bpm PR-I 170 ms QRS 80 ms QT/QTc 344/450 Sinus tachycardia  No results found for this or any previous visit (from the past 72 hour(s)). No results found.  Lab Results  Component Value Date   HGBA1C 7.4 (A) 04/29/2018      Assessment and Plan: 52 y.o. male with   .Diagnoses and all orders for this visit:  History of dizziness  Tachycardia with heart rate 100-120 beats per minute -     EKG 12-Lead  Hypertension associated with type 2 diabetes mellitus ([facility]) -     losartan (COZAAR) 50 MG tablet; Take 1 tablet (50 mg total) by mouth daily.  Type 2 diabetes mellitus with hyperglycemia, without long-term current use of insulin ([facility]) Comments: Discussed A1c goals and triple therapy with patient, lifestyle changes,  he may end up on insulin He does not want injectables/insulin at this time.  Hyperlipidemia associated with type 2 diabetes mellitus ([facility]) -     simvastatin (ZOCOR) 40 MG tablet; Take 1 tablet (40 mg total) by mouth daily.   -  Vague episodic dizziness that resolved spontaneously 48 hours ago, reassuring neuro exam today. He reports he saw his ENT doctor yesterday and was told everything was okay - ECG today shows sinus tachycardia with left axis deviation. BP is mildly hypertensive 136/86. Consider adding beta blocker if this persists - he has risk factors for stroke/TIA including HTN and diabetes - encouraged to monitor blood pressure and blood glucose at home - cont statin, baby asa and ARB - advised to return if symptoms recur   Patient education and anticipatory guidance given Patient agrees with treatment plan Follow-up with PCP in 2 months or sooner as needed if symptoms worsen or fail to improve  Darlyne Russian PA-C

## 2018-06-05 ENCOUNTER — Encounter: Payer: Self-pay | Admitting: Osteopathic Medicine

## 2018-06-05 DIAGNOSIS — H6090 Unspecified otitis externa, unspecified ear: Secondary | ICD-10-CM | POA: Insufficient documentation

## 2018-06-08 ENCOUNTER — Encounter: Payer: Self-pay | Admitting: Physician Assistant

## 2018-06-26 LAB — HM DIABETES EYE EXAM

## 2018-07-03 MED FILL — JANUVIA 100 MG TABLET: 100 | 90 days supply | Qty: 90 | Fill #0

## 2018-07-03 MED FILL — SIMVASTATIN 40 MG TABLET: 40 | 90 days supply | Qty: 90 | Fill #2

## 2018-08-27 ENCOUNTER — Encounter: Payer: 59 | Admitting: Osteopathic Medicine

## 2018-09-03 ENCOUNTER — Encounter: Payer: Self-pay | Admitting: Osteopathic Medicine

## 2018-09-29 MED FILL — LOSARTAN POTASSIUM 50 MG TA: 50 | 90 days supply | Qty: 90 | Fill #1

## 2018-09-29 MED FILL — JANUVIA 100 MG TABLET: 100 | 90 days supply | Qty: 90 | Fill #1

## 2018-09-29 MED FILL — SIMVASTATIN 40 MG TABLET: 40 | 90 days supply | Qty: 90 | Fill #3

## 2018-09-29 MED FILL — metFORMIN HCL ER 500 MG TB2: 500 | 90 days supply | Qty: 360 | Fill #1

## 2018-11-16 ENCOUNTER — Other Ambulatory Visit: Payer: Self-pay

## 2018-11-16 ENCOUNTER — Other Ambulatory Visit: Payer: Self-pay | Admitting: Osteopathic Medicine

## 2018-11-16 ENCOUNTER — Other Ambulatory Visit: Payer: Self-pay | Admitting: Physician Assistant

## 2018-11-16 DIAGNOSIS — E1165 Type 2 diabetes mellitus with hyperglycemia: Secondary | ICD-10-CM

## 2018-11-16 DIAGNOSIS — E785 Hyperlipidemia, unspecified: Secondary | ICD-10-CM

## 2018-11-16 DIAGNOSIS — E1169 Type 2 diabetes mellitus with other specified complication: Secondary | ICD-10-CM

## 2018-11-16 DIAGNOSIS — I10 Essential (primary) hypertension: Principal | ICD-10-CM

## 2018-11-16 DIAGNOSIS — E1159 Type 2 diabetes mellitus with other circulatory complications: Secondary | ICD-10-CM

## 2018-11-16 MED ORDER — METFORMIN HCL ER 500 MG PO TB24: 1000.0000 mg | ORAL_TABLET | Freq: Two times a day (BID) | ORAL | 0 refills | Status: DC

## 2018-11-17 ENCOUNTER — Other Ambulatory Visit: Payer: Self-pay | Admitting: Osteopathic Medicine

## 2018-11-17 DIAGNOSIS — E1165 Type 2 diabetes mellitus with hyperglycemia: Secondary | ICD-10-CM

## 2018-12-09 ENCOUNTER — Other Ambulatory Visit: Payer: Self-pay | Admitting: Physician Assistant

## 2018-12-09 ENCOUNTER — Other Ambulatory Visit: Payer: Self-pay | Admitting: Osteopathic Medicine

## 2018-12-09 DIAGNOSIS — E1165 Type 2 diabetes mellitus with hyperglycemia: Secondary | ICD-10-CM

## 2018-12-09 DIAGNOSIS — I152 Hypertension secondary to endocrine disorders: Secondary | ICD-10-CM

## 2018-12-09 DIAGNOSIS — I10 Essential (primary) hypertension: Principal | ICD-10-CM

## 2018-12-09 DIAGNOSIS — E1159 Type 2 diabetes mellitus with other circulatory complications: Secondary | ICD-10-CM

## 2018-12-09 MED FILL — metFORMIN HCL ER 500 MG TB2: 500 | 90 days supply | Qty: 360 | Fill #0

## 2018-12-09 MED FILL — SIMVASTATIN 40 MG TABLET: 40 | 90 days supply | Qty: 90 | Fill #0

## 2018-12-09 MED FILL — LOSARTAN POTASSIUM 50 MG TA: 50 | 30 days supply | Qty: 30 | Fill #0

## 2018-12-09 NOTE — Telephone Encounter (Signed)
Refill request routed to Dr. Mardelle Matte rx refill pool

## 2018-12-14 ENCOUNTER — Ambulatory Visit (INDEPENDENT_AMBULATORY_CARE_PROVIDER_SITE_OTHER): Payer: No Typology Code available for payment source | Admitting: Osteopathic Medicine

## 2018-12-14 ENCOUNTER — Encounter: Payer: Self-pay | Admitting: Osteopathic Medicine

## 2018-12-14 DIAGNOSIS — E1165 Type 2 diabetes mellitus with hyperglycemia: Secondary | ICD-10-CM | POA: Diagnosis not present

## 2018-12-14 DIAGNOSIS — E785 Hyperlipidemia, unspecified: Secondary | ICD-10-CM | POA: Diagnosis not present

## 2018-12-14 DIAGNOSIS — E1159 Type 2 diabetes mellitus with other circulatory complications: Secondary | ICD-10-CM

## 2018-12-14 DIAGNOSIS — I10 Essential (primary) hypertension: Secondary | ICD-10-CM

## 2018-12-14 DIAGNOSIS — E1169 Type 2 diabetes mellitus with other specified complication: Secondary | ICD-10-CM

## 2018-12-14 MED ORDER — SIMVASTATIN 40 MG PO TABS: 40.0000 mg | ORAL_TABLET | Freq: Every day | ORAL | 3 refills | Status: DC

## 2018-12-14 MED ORDER — METFORMIN HCL ER 500 MG PO TB24: 1000.0000 mg | ORAL_TABLET | Freq: Two times a day (BID) | ORAL | 0 refills | Status: DC

## 2018-12-14 MED ORDER — SITAGLIPTIN PHOSPHATE 100 MG PO TABS: 100.0000 mg | ORAL_TABLET | Freq: Every day | ORAL | 0 refills | Status: DC

## 2018-12-14 MED ORDER — LOSARTAN POTASSIUM 50 MG PO TABS: 50.0000 mg | ORAL_TABLET | Freq: Every day | ORAL | 0 refills | Status: DC

## 2018-12-14 MED ORDER — ACCU-CHEK GUIDE ME W/DEVICE KIT: 1.0000 [IU] | PACK | Freq: Every day | 99 refills | Status: DC

## 2018-12-14 MED ORDER — ACCU-CHEK FASTCLIX LANCET KIT: PACK | 99 refills | Status: DC

## 2018-12-14 MED ORDER — GLUCOSE BLOOD VI STRP: ORAL_STRIP | 99 refills | Status: DC

## 2018-12-14 NOTE — Progress Notes (Signed)
Virtual Visit  via Video or Phone Note  I connected with      Johnathan Warner on 12/14/18 at 1:35 by a telemedicine application and verified that I am speaking with the correct person using two identifiers.   I discussed the limitations of evaluation and management by telemedicine and the availability of in person appointments. The patient expressed understanding and agreed to proceed.  History of Present Illness: Johnathan Warner is a 53 y.o. male who would like to discuss diabetes follow-up   Last A1C was 7 mos ago 04/2018, 7.4   DIABETES SCREENING/PREVENTIVE CARE: Updated 04/29/18  A1C past 3-6 mos: no  01/19/16 8.3%   10/23/16: 10.4% - initiated triple therapy: Jardiance 25 daily, Januvia 100 daily, metformin XR 1000 daily  06/05/17: 7.7% yay!   12/18/17: 8.3% discussed diet/exercise and if no better would need add medication.   04/29/18: 7.4% discussed improvement is good but not quite to goal  Today, 12/14/18 virtual visit d/t coronavirus pandemic - no A1C on file since last visit. Taking Jardiance 25 mg daily, Januvia 100 mg daily, metformin total 2000 mg daily. Has been checking Glc  BP goal <130/80:  BP Readings from Last 3 Encounters:  05/28/18 136/86  04/29/18 129/76  04/14/18 131/84  LDL goal <70-100: LDL 66 greater than one year ago  Eye exam annually: None on file, importance discussed with patient Foot exam: No  Microalbuminuria: n/a Metformin: Yes  ACE/ARB: Yes  Antiplatelet if ASCVD Risk >10%: Yes  Statin: Yes  Pneumovax: No - declined     Observations/Objective: There were no vitals taken for this visit. BP Readings from Last 3 Encounters:  05/28/18 136/86  04/29/18 129/76  04/14/18 131/84   Exam: Normal Speech.   Lab and Radiology Results No results found for this or any previous visit (from the past 72 hour(s)). No results found.     Assessment and Plan: 53 y.o. male with Diagnoses of Type 2 diabetes mellitus with hyperglycemia, without  long-term current use of insulin ([place]), Hyperlipidemia associated with type 2 diabetes mellitus ([place]), and Hypertension associated with type 2 diabetes mellitus ([place]) were pertinent to this visit.   PDMP not reviewed this encounter. Orders Placed This Encounter  Procedures  . CBC  . COMPLETE METABOLIC PANEL WITH GFR  . Lipid panel  . Hemoglobin A1c   Meds ordered this encounter  Medications  . sitaGLIPtin (JANUVIA) 100 MG tablet    Sig: Take 1 tablet (100 mg total) by mouth daily.    Dispense:  90 tablet    Refill:  0  . simvastatin (ZOCOR) 40 MG tablet    Sig: Take 1 tablet (40 mg total) by mouth daily.    Dispense:  90 tablet    Refill:  3  . metFORMIN (GLUCOPHAGE-XR) 500 MG 24 hr tablet    Sig: Take 2 tablets (1,000 mg total) by mouth 2 (two) times daily.    Dispense:  360 tablet    Refill:  0    No refills. Pt is overdue for DM check w/Provider.  Marland Kitchen losartan (COZAAR) 50 MG tablet    Sig: Take 1 tablet (50 mg total) by mouth daily.    Dispense:  90 tablet    Refill:  0    NO Refills. Pt is overdue for DM check with Provider.  . Blood Glucose Monitoring Suppl (ACCU-CHEK GUIDE ME) w/Device KIT    Sig: 1 Units by Does not apply route daily. Needs glucometer    Dispense:  1 kit    Refill:  prn  . glucose blood (ACCU-CHEK GUIDE) test strip    Sig: Use as instructed 1-4 TIMES DAILY    Dispense:  100 each    Refill:  99  . Lancets Misc. (ACCU-CHEK FASTCLIX LANCET) KIT    Sig: Use as directed 1-4 times daily    Dispense:  100 kit    Refill:  99   There are no Patient Instructions on file for this visit. Instructions sent via MyChart. If MyChart not available, pt was given option for info via personal e-mail w/ no guarantee of protected health info over unsecured e-mail communication, and MyChart sign-up instructions were included.   Follow Up Instructions: Return in about 3 months (around 03/15/2019) for follow up DM2 .    I discussed the assessment and treatment plan with  the patient. The patient was provided an opportunity to ask questions and all were answered. The patient agreed with the plan and demonstrated an understanding of the instructions.   The patient was advised to call back or seek an in-person evaluation if the symptoms worsen or if the condition fails to improve as anticipated.  I provided 25 minutes of non-face-to-face time during this encounter.                      Historical information moved to improve visibility of documentation.  Past Medical History:  Diagnosis Date  . Diabetes mellitus without complication ([place])   . Hyperlipemia   . Hypertension    No past surgical history on file. Social History   Tobacco Use  . Smoking status: Former Smoker    Last attempt to quit: 1994    Years since quitting: 26.2  . Smokeless tobacco: Never Used  Substance Use Topics  . Alcohol use: No   Family history is unknown by patient.  Medications: Current Outpatient Medications  Medication Sig Dispense Refill  . aspirin EC 81 MG tablet Take 1 tablet (81 mg total) by mouth daily. 90 tablet 3  . blood glucose meter kit and supplies KIT Dispense based on patient and insurance preference. Use daily as directed. Please include lancets #100 refill 99, test strips #100 refill 99, control solution. Dx: E11.65 1 each PRN  . Ciclopirox 1 % shampoo Shampoo scalp 2 times per week for 4 weeks.  Leave on for 3 minutes then rinse off. 120 mL 1  . glucose blood (FREESTYLE TEST STRIPS) test strip Once daily    . Lancets (FREESTYLE) lancets Use as instructed 100 each 99  . losartan (COZAAR) 50 MG tablet Take 1 tablet (50 mg total) by mouth daily. 90 tablet 0  . metFORMIN (GLUCOPHAGE-XR) 500 MG 24 hr tablet Take 2 tablets (1,000 mg total) by mouth 2 (two) times daily. 360 tablet 0  . simvastatin (ZOCOR) 40 MG tablet Take 1 tablet (40 mg total) by mouth daily. 90 tablet 3  . sitaGLIPtin (JANUVIA) 100 MG tablet Take 1 tablet (100 mg total) by  mouth daily. 90 tablet 0  . Blood Glucose Monitoring Suppl (ACCU-CHEK GUIDE ME) w/Device KIT 1 Units by Does not apply route daily. Needs glucometer 1 kit prn  . empagliflozin (JARDIANCE) 25 MG TABS tablet Take 25 mg by mouth daily. (Patient not taking: Reported on 12/14/2018) 90 tablet 1  . glucose blood (ACCU-CHEK GUIDE) test strip Use as instructed 1-4 TIMES DAILY 100 each 99  . Lancets Misc. (ACCU-CHEK FASTCLIX LANCET) KIT Use as directed 1-4 times daily 100  kit 99   No current facility-administered medications for this visit.    Allergies  Allergen Reactions  . Sulfa Antibiotics Other (See Comments)    Hot flashes photodermatitis   . Amlodipine Other (See Comments)    umknown  . Atorvastatin Other (See Comments)  . Celecoxib Other (See Comments)    photodermatitis  . Hydrochlorothiazide Other (See Comments)    photodermatitis  . Lisinopril Other (See Comments)    Cough  . Simvastatin Other (See Comments)    Inc LFT's  . Sulfamethoxazole     photodermatitis  . Sulfonamide Derivatives   . Tetanus Toxoid, Adsorbed Other (See Comments)    unknown     PDMP not reviewed this encounter. Orders Placed This Encounter  Procedures  . CBC  . COMPLETE METABOLIC PANEL WITH GFR  . Lipid panel  . Hemoglobin A1c   Meds ordered this encounter  Medications  . sitaGLIPtin (JANUVIA) 100 MG tablet    Sig: Take 1 tablet (100 mg total) by mouth daily.    Dispense:  90 tablet    Refill:  0  . simvastatin (ZOCOR) 40 MG tablet    Sig: Take 1 tablet (40 mg total) by mouth daily.    Dispense:  90 tablet    Refill:  3  . metFORMIN (GLUCOPHAGE-XR) 500 MG 24 hr tablet    Sig: Take 2 tablets (1,000 mg total) by mouth 2 (two) times daily.    Dispense:  360 tablet    Refill:  0    No refills. Pt is overdue for DM check w/Provider.  Marland Kitchen losartan (COZAAR) 50 MG tablet    Sig: Take 1 tablet (50 mg total) by mouth daily.    Dispense:  90 tablet    Refill:  0    NO Refills. Pt is overdue for DM  check with Provider.  . Blood Glucose Monitoring Suppl (ACCU-CHEK GUIDE ME) w/Device KIT    Sig: 1 Units by Does not apply route daily. Needs glucometer    Dispense:  1 kit    Refill:  prn  . glucose blood (ACCU-CHEK GUIDE) test strip    Sig: Use as instructed 1-4 TIMES DAILY    Dispense:  100 each    Refill:  99  . Lancets Misc. (ACCU-CHEK FASTCLIX LANCET) KIT    Sig: Use as directed 1-4 times daily    Dispense:  100 kit    Refill:  99

## 2018-12-15 ENCOUNTER — Telehealth: Payer: Self-pay | Admitting: Osteopathic Medicine

## 2018-12-15 NOTE — Telephone Encounter (Signed)
Appointment has been scheduled. No further questions at this time.  °

## 2018-12-15 NOTE — Telephone Encounter (Signed)
-----   Message from [name], DO sent at 12/14/2018  4:22 PM EDT ----- Needs f/u 3 mos in office for A1C/diabetes

## 2018-12-21 MED FILL — JANUVIA 100 MG TABLET: 100 | 90 days supply | Qty: 90 | Fill #0

## 2019-02-10 MED FILL — LOSARTAN POTASSIUM 50 MG TA: 50 | 90 days supply | Qty: 90 | Fill #0

## 2019-02-26 ENCOUNTER — Encounter: Payer: Self-pay | Admitting: Osteopathic Medicine

## 2019-02-26 ENCOUNTER — Ambulatory Visit (INDEPENDENT_AMBULATORY_CARE_PROVIDER_SITE_OTHER): Payer: No Typology Code available for payment source | Admitting: Osteopathic Medicine

## 2019-02-26 VITALS — BP 123/79 | HR 75 | Temp 98.3°F | Wt 170.6 lb

## 2019-02-26 DIAGNOSIS — Z2820 Immunization not carried out because of patient decision for unspecified reason: Secondary | ICD-10-CM | POA: Diagnosis not present

## 2019-02-26 DIAGNOSIS — E1165 Type 2 diabetes mellitus with hyperglycemia: Secondary | ICD-10-CM | POA: Diagnosis not present

## 2019-02-26 DIAGNOSIS — Z0000 Encounter for general adult medical examination without abnormal findings: Secondary | ICD-10-CM | POA: Diagnosis not present

## 2019-02-26 DIAGNOSIS — Z5320 Procedure and treatment not carried out because of patient's decision for unspecified reasons: Secondary | ICD-10-CM | POA: Diagnosis not present

## 2019-02-26 DIAGNOSIS — I10 Essential (primary) hypertension: Secondary | ICD-10-CM

## 2019-02-26 DIAGNOSIS — Z23 Encounter for immunization: Secondary | ICD-10-CM

## 2019-02-26 DIAGNOSIS — E785 Hyperlipidemia, unspecified: Secondary | ICD-10-CM

## 2019-02-26 DIAGNOSIS — E1159 Type 2 diabetes mellitus with other circulatory complications: Secondary | ICD-10-CM

## 2019-02-26 DIAGNOSIS — E1169 Type 2 diabetes mellitus with other specified complication: Secondary | ICD-10-CM

## 2019-02-26 MED ORDER — SITAGLIPTIN PHOSPHATE 100 MG PO TABS: 100.0000 mg | ORAL_TABLET | Freq: Every day | ORAL | 3 refills | Status: DC

## 2019-02-26 MED ORDER — LOSARTAN POTASSIUM 50 MG PO TABS: 50.0000 mg | ORAL_TABLET | Freq: Every day | ORAL | 3 refills | Status: DC

## 2019-02-26 MED ORDER — ASPIRIN EC 81 MG PO TBEC: 81.0000 mg | DELAYED_RELEASE_TABLET | Freq: Every day | ORAL | 3 refills | Status: AC

## 2019-02-26 MED ORDER — METFORMIN HCL ER 500 MG PO TB24: 1000.0000 mg | ORAL_TABLET | Freq: Two times a day (BID) | ORAL | 3 refills | Status: DC

## 2019-02-26 MED ORDER — SIMVASTATIN 40 MG PO TABS: 40.0000 mg | ORAL_TABLET | Freq: Every day | ORAL | 3 refills | Status: DC

## 2019-02-26 MED ORDER — EMPAGLIFLOZIN 25 MG PO TABS: 25.0000 mg | ORAL_TABLET | Freq: Every day | ORAL | 3 refills | Status: DC

## 2019-02-26 MED FILL — ASPIRIN ADULT LOW STRENGTH: 81 | 90 days supply | Qty: 90 | Fill #0

## 2019-02-26 MED FILL — SIMVASTATIN 40 MG TABLET: 40 | 90 days supply | Qty: 90 | Fill #0

## 2019-02-26 MED FILL — metFORMIN HCL ER 500 MG TB2: 500 | 90 days supply | Qty: 360 | Fill #0

## 2019-02-26 MED FILL — JANUVIA 100 MG TABLET: 100 | 90 days supply | Qty: 90 | Fill #0

## 2019-02-26 MED FILL — JARDIANCE 25 MG TABLET: 25 | 90 days supply | Qty: 90 | Fill #0

## 2019-02-26 NOTE — Progress Notes (Signed)
HPI: Johnathan Warner is a 53 y.o. male who  has a past medical history of Diabetes mellitus without complication ([place]), Hyperlipemia, and Hypertension.  he presents to [facility] today, 02/26/19,  for chief complaint of: Annual physical     Patient here for annual physical / wellness exam.  See preventive care reviewed as below.    Additional concerns today include:  Shoulder pain (pt advised f/u w/ sports med)        Past medical, surgical, social and family history reviewed:  Patient Active Problem List   Diagnosis Date Noted  . Otitis externa 06/05/2018  . History of dizziness 05/28/2018  . Tachycardia with heart rate 100-120 beats per minute 05/28/2018  . Type 2 diabetes mellitus with hyperglycemia, without long-term current use of insulin ([place]) 10/23/2016  . Essential hypertension 10/23/2016  . Hyperlipidemia associated with type 2 diabetes mellitus ([place]) 10/23/2016    No past surgical history on file.  Social History   Tobacco Use  . Smoking status: Former Smoker    Quit date: 1994    Years since quitting: 26.5  . Smokeless tobacco: Never Used  Substance Use Topics  . Alcohol use: No    Family History  Family history unknown: Yes     Current medication list and allergy/intolerance information reviewed:    Current Outpatient Medications  Medication Sig Dispense Refill  . aspirin EC 81 MG tablet Take 1 tablet (81 mg total) by mouth daily. 90 tablet 3  . blood glucose meter kit and supplies KIT Dispense based on patient and insurance preference. Use daily as directed. Please include lancets #100 refill 99, test strips #100 refill 99, control solution. Dx: E11.65 1 each PRN  . Blood Glucose Monitoring Suppl (ACCU-CHEK GUIDE ME) w/Device KIT 1 Units by Does not apply route daily. Needs glucometer 1 kit prn  . Ciclopirox 1 % shampoo Shampoo scalp 2 times per week for 4 weeks.  Leave on for 3 minutes then rinse off. 120 mL 1   . empagliflozin (JARDIANCE) 25 MG TABS tablet Take 25 mg by mouth daily. (Patient not taking: Reported on 12/14/2018) 90 tablet 1  . glucose blood (ACCU-CHEK GUIDE) test strip Use as instructed 1-4 TIMES DAILY 100 each 99  . glucose blood (FREESTYLE TEST STRIPS) test strip Once daily    . Lancets (FREESTYLE) lancets Use as instructed 100 each 99  . Lancets Misc. (ACCU-CHEK FASTCLIX LANCET) KIT Use as directed 1-4 times daily 100 kit 99  . losartan (COZAAR) 50 MG tablet Take 1 tablet (50 mg total) by mouth daily. 90 tablet 0  . metFORMIN (GLUCOPHAGE-XR) 500 MG 24 hr tablet Take 2 tablets (1,000 mg total) by mouth 2 (two) times daily. 360 tablet 0  . simvastatin (ZOCOR) 40 MG tablet Take 1 tablet (40 mg total) by mouth daily. 90 tablet 3  . sitaGLIPtin (JANUVIA) 100 MG tablet Take 1 tablet (100 mg total) by mouth daily. 90 tablet 0   No current facility-administered medications for this visit.     Allergies  Allergen Reactions  . Sulfa Antibiotics Other (See Comments)    Hot flashes photodermatitis   . Amlodipine Other (See Comments)    umknown  . Atorvastatin Other (See Comments)  . Celecoxib Other (See Comments)    photodermatitis  . Hydrochlorothiazide Other (See Comments)    photodermatitis  . Lisinopril Other (See Comments)    Cough  . Simvastatin Other (See Comments)    Inc LFT's  .  Sulfamethoxazole     photodermatitis  . Sulfonamide Derivatives   . Tetanus Toxoid, Adsorbed Other (See Comments)    unknown       Review of Systems:  Constitutional:  No  fever, no chills, No recent illness, No unintentional weight changes. No significant fatigue.   HEENT: No  headache, no vision change, no hearing change, No sore throat, No  sinus pressure  Cardiac: No  chest pain, No  pressure, No palpitations, No  Orthopnea  Respiratory:  No  shortness of breath. No  Cough  Gastrointestinal: No  abdominal pain, No  nausea, No  vomiting,  No  blood in stool, No  diarrhea, No   constipation   Musculoskeletal: +new myalgia/arthralgia  Skin: No  Rash, No other wounds/concerning lesions  Genitourinary: No  incontinence, No  abnormal genital bleeding, No abnormal genital discharge  Hem/Onc: No  easy bruising/bleeding, No  abnormal lymph node  Endocrine: No cold intolerance,  No heat intolerance. No polyuria/polydipsia/polyphagia   Neurologic: No  weakness, No  dizziness, No  slurred speech/focal weakness/facial droop  Psychiatric: No  concerns with depression, No  concerns with anxiety, No sleep problems, No mood problems  Exam:  BP 123/79 (BP Location: Left Arm, Patient Position: Sitting, Cuff Size: Normal)   Pulse 75   Temp 98.3 F (36.8 C) (Oral)   Wt 170 lb 9.6 oz (77.4 kg)   BMI 27.54 kg/m   Constitutional: VS see above. General Appearance: alert, well-developed, well-nourished, NAD  Eyes: Normal lids and conjunctive, non-icteric sclera  Ears, Nose, Mouth, Throat: wearing mask   Neck: No masses, trachea midline. No thyroid enlargement. No tenderness/mass appreciated. No lymphadenopathy  Respiratory: Normal respiratory effort. no wheeze, no rhonchi, no rales  Cardiovascular: S1/S2 normal, no murmur, no rub/gallop auscultated. RRR.   Gastrointestinal: Nontender, no masses. No hepatomegaly, no splenomegaly. No hernia appreciated.   Musculoskeletal: Gait normal. No clubbing/cyanosis of digits.   Neurological: Normal balance/coordination. No tremor. No cranial nerve deficit on limited exam. Motor and sensation intact and symmetric. Cerebellar reflexes intact.   Skin: warm, dry, intact. No rash/ulcer. No concerning nevi or subq nodules on limited exam.    Psychiatric: Normal judgment/insight. Normal mood and affect. Oriented x3.    No results found for this or any previous visit (from the past 72 hour(s)).  No results found.   ASSESSMENT/PLAN: The primary encounter diagnosis was Annual physical exam. Diagnoses of Vaccine refused by patient,  Colon cancer screening declined, Type 2 diabetes mellitus with hyperglycemia, without long-term current use of insulin ([place]), Hyperlipidemia associated with type 2 diabetes mellitus ([place]), and Hypertension associated with type 2 diabetes mellitus ([place]) were also pertinent to this visit.   Orders Placed This Encounter  Procedures  . CBC  . COMPLETE METABOLIC PANEL WITH GFR  . Lipid panel  . Hemoglobin A1c    Meds ordered this encounter  Medications  . losartan (COZAAR) 50 MG tablet    Sig: Take 1 tablet (50 mg total) by mouth daily.    Dispense:  90 tablet    Refill:  3  . empagliflozin (JARDIANCE) 25 MG TABS tablet    Sig: Take 25 mg by mouth daily.    Dispense:  90 tablet    Refill:  3  . aspirin EC 81 MG tablet    Sig: Take 1 tablet (81 mg total) by mouth daily.    Dispense:  90 tablet    Refill:  3  . metFORMIN (GLUCOPHAGE-XR) 500 MG 24 hr tablet  Sig: Take 2 tablets (1,000 mg total) by mouth 2 (two) times daily.    Dispense:  360 tablet    Refill:  3  . simvastatin (ZOCOR) 40 MG tablet    Sig: Take 1 tablet (40 mg total) by mouth daily.    Dispense:  90 tablet    Refill:  3  . sitaGLIPtin (JANUVIA) 100 MG tablet    Sig: Take 1 tablet (100 mg total) by mouth daily.    Dispense:  90 tablet    Refill:  3    Patient Instructions  General Preventive Care  Most recent routine screening lipids/other labs: ordered today   Blood pressure goal 130/80 or less.   Tobacco: don't!   Alcohol: responsible moderation is ok for most adults - if you have concerns about your alcohol intake, please talk to me!   Exercise: as tolerated to reduce risk of cardiovascular disease and diabetes. Strength training will also prevent osteoporosis.   Mental health: if need for mental health care (medicines, counseling, other), or concerns about moods, please let me know!   Sexual health: if need for STD testing, or if concerns with libido/pain problems, please let me know!   Advanced  Directive: Living Will and/or Healthcare Power of Attorney recommended for all adults, regardless of age or health.  Vaccines  Flu vaccine: recommended for almost everyone, every fall.   Shingles vaccine: Shingrix recommended after age 79.  Pneumonia vaccines: Prevnar and Pneumovax recommended after age 101, or sooner if certain medical conditions.  Tetanus booster: Tdap recommended every 10 years.  Cancer screenings   Colon cancer screening: recommended for everyone at age 6-75  Prostate cancer screening: PSA blood test around age 13   Lung cancer screening: not needed if quit >15 years ago  Infection screenings . HIV, Gonorrhea/Chlamydia: screening as needed . Hepatitis C: recommended for anyone born 76-1966 . TB: certain at-risk populations, or depending on work requirements and/or travel history Other . Bone Density Test: recommended for men at age 67, sooner depending on risk factors . Abdominal Aortic Aneurysm: screening with ultrasound recommended once for men age 50-75 who have ever smoked         Visit summary with medication list and pertinent instructions was printed for patient to review. All questions at time of visit were answered - patient instructed to contact office with any additional concerns or updates. ER/RTC precautions were reviewed with the patient.    Please note: voice recognition software was used to produce this document, and typos may escape review. Please contact Dr. Sheppard Coil for any needed clarifications.     Follow-up plan: Return in about 3 months (around 05/29/2019) for follow up on diabetes, see me sooner if needed.

## 2019-02-26 NOTE — Addendum Note (Signed)
Addended by: Mertha Finders on: 02/26/2019 10:04 AM   Modules accepted: Orders

## 2019-02-26 NOTE — Patient Instructions (Addendum)
General Preventive Care  Most recent routine screening lipids/other labs: ordered today   Blood pressure goal 130/80 or less.   Tobacco: don't!   Alcohol: responsible moderation is ok for most adults - if you have concerns about your alcohol intake, please talk to me!   Exercise: as tolerated to reduce risk of cardiovascular disease and diabetes. Strength training will also prevent osteoporosis.   Mental health: if need for mental health care (medicines, counseling, other), or concerns about moods, please let me know!   Sexual health: if need for STD testing, or if concerns with libido/pain problems, please let me know!   Advanced Directive: Living Will and/or Healthcare Power of Attorney recommended for all adults, regardless of age or health.  Vaccines  Flu vaccine: recommended for almost everyone, every fall.   Shingles vaccine: Shingrix recommended after age 81.  Pneumonia vaccines: Prevnar and Pneumovax recommended after age 81, or sooner if certain medical conditions.  Tetanus booster: Tdap recommended every 10 years.  Cancer screenings   Colon cancer screening: recommended for everyone at age 58-75  Prostate cancer screening: PSA blood test around age 92   Lung cancer screening: not needed if quit >15 years ago  Infection screenings . HIV, Gonorrhea/Chlamydia: screening as needed . Hepatitis C: recommended for anyone born 19-1966 . TB: certain at-risk populations, or depending on work requirements and/or travel history Other . Bone Density Test: recommended for men at age 41, sooner depending on risk factors . Abdominal Aortic Aneurysm: screening with ultrasound recommended once for men age 64-75 who have ever smoked

## 2019-02-27 LAB — LIPID PANEL
Cholesterol: 139 mg/dL (ref <200)
HDL: 29 mg/dL — ABNORMAL LOW (ref >=40)
LDL Cholesterol (Calc): 79 mg/dL (calc)
Non-HDL Cholesterol (Calc): 110 mg/dL (calc) (ref <130)
Total CHOL/HDL Ratio: 4.8 (calc) (ref <5.0)
Triglycerides: 224 mg/dL — ABNORMAL HIGH (ref <150)

## 2019-02-27 LAB — CBC
HCT: 41.6 % (ref 38.5–50.0)
Hemoglobin: 13.8 g/dL (ref 13.2–17.1)
MCH: 30.5 pg (ref 27.0–33.0)
MCHC: 33.2 g/dL (ref 32.0–36.0)
MCV: 91.8 fL (ref 80.0–100.0)
MPV: 10.3 fL (ref 7.5–12.5)
Platelets: 304 10*3/uL (ref 140–400)
RBC: 4.53 10*6/uL (ref 4.20–5.80)
RDW: 11.9 % (ref 11.0–15.0)
WBC: 7.5 10*3/uL (ref 3.8–10.8)

## 2019-02-27 LAB — COMPLETE METABOLIC PANEL WITH GFR
AG Ratio: 1.8 (calc) (ref 1.0–2.5)
ALT: 36 U/L (ref 9–46)
AST: 26 U/L (ref 10–35)
Albumin: 4.5 g/dL (ref 3.6–5.1)
Alkaline phosphatase (APISO): 55 U/L (ref 35–144)
BUN: 11 mg/dL (ref 7–25)
CO2: 28 mmol/L (ref 20–32)
Calcium: 9.6 mg/dL (ref 8.6–10.3)
Chloride: 103 mmol/L (ref 98–110)
Creat: 0.98 mg/dL (ref 0.70–1.33)
GFR, Est African American: 102 mL/min/{1.73_m2} (ref >=60)
GFR, Est Non African American: 88 mL/min/{1.73_m2} (ref >=60)
Globulin: 2.5 g/dL (calc) (ref 1.9–3.7)
Glucose, Bld: 152 mg/dL — ABNORMAL HIGH (ref 65–99)
Potassium: 4.5 mmol/L (ref 3.5–5.3)
Sodium: 139 mmol/L (ref 135–146)
Total Bilirubin: 0.6 mg/dL (ref 0.2–1.2)
Total Protein: 7 g/dL (ref 6.1–8.1)

## 2019-02-27 LAB — HEMOGLOBIN A1C
Hgb A1c MFr Bld: 6.9 % of total Hgb — ABNORMAL HIGH (ref <5.7)
Mean Plasma Glucose: 151 (calc)
eAG (mmol/L): 8.4 (calc)

## 2019-03-02 ENCOUNTER — Encounter: Payer: No Typology Code available for payment source | Admitting: Osteopathic Medicine

## 2019-03-04 ENCOUNTER — Other Ambulatory Visit: Payer: Self-pay | Admitting: Osteopathic Medicine

## 2019-03-04 DIAGNOSIS — E1165 Type 2 diabetes mellitus with hyperglycemia: Secondary | ICD-10-CM

## 2019-03-04 MED FILL — FREESTYLE INSULINX TEST STR: 25 days supply | Qty: 100 | Fill #0

## 2019-03-04 MED FILL — FREESTYLE LANCETS: 25 days supply | Qty: 100 | Fill #0

## 2019-03-16 MED FILL — DOXYCYCLINE HYCLATE 100 MG: 100 | 7 days supply | Qty: 14 | Fill #0

## 2019-03-16 MED FILL — IBUPROFEN 600 MG TABLET: 600 | 4 days supply | Qty: 16 | Fill #0

## 2019-03-19 ENCOUNTER — Ambulatory Visit: Payer: No Typology Code available for payment source | Admitting: Osteopathic Medicine

## 2019-04-07 MED FILL — ACCU-CHEK FASTCLIX LANCETS: 26 days supply | Qty: 102 | Fill #0

## 2019-04-07 MED FILL — ACCU-CHEK GUIDE STRP: 25 days supply | Qty: 100 | Fill #0

## 2019-04-08 ENCOUNTER — Telehealth: Payer: Self-pay | Admitting: Osteopathic Medicine

## 2019-04-08 MED ORDER — GLUCOSE BLOOD VI STRP: ORAL_STRIP | 99 refills | Status: DC

## 2019-04-08 NOTE — Telephone Encounter (Signed)
-----   Message from Willy Eddy, RN sent at 04/06/2019  3:05 PM EDT ----- Regarding: Epic Error Good afternoon Dr. Sheppard Coil,   Do to an issue with Epic the refill order did not go through or was auto cancelled.  We are working with Epic and hope to have this issue resolved by 04/22/19.  We apologize for any inconvenience this may cause.  Please place a new order for the medication.  03/04/2019 FREESTYLE INSULINX TEST VI STRP  Thank you,  Zebedee Iba, RN, BSN Ambulatory Analyst

## 2019-05-08 MED FILL — AMOXICILLIN 500 MG CAPSULE: 500 | 7 days supply | Qty: 21 | Fill #0

## 2019-05-18 ENCOUNTER — Other Ambulatory Visit: Payer: Self-pay

## 2019-05-18 ENCOUNTER — Encounter: Payer: Self-pay | Admitting: Osteopathic Medicine

## 2019-05-18 ENCOUNTER — Ambulatory Visit (INDEPENDENT_AMBULATORY_CARE_PROVIDER_SITE_OTHER): Payer: No Typology Code available for payment source | Admitting: Osteopathic Medicine

## 2019-05-18 ENCOUNTER — Ambulatory Visit (INDEPENDENT_AMBULATORY_CARE_PROVIDER_SITE_OTHER): Payer: No Typology Code available for payment source

## 2019-05-18 VITALS — BP 136/82 | HR 80 | Temp 98.2°F | Wt 168.0 lb

## 2019-05-18 DIAGNOSIS — E1169 Type 2 diabetes mellitus with other specified complication: Secondary | ICD-10-CM

## 2019-05-18 DIAGNOSIS — G8929 Other chronic pain: Secondary | ICD-10-CM | POA: Diagnosis not present

## 2019-05-18 DIAGNOSIS — M25511 Pain in right shoulder: Secondary | ICD-10-CM

## 2019-05-18 DIAGNOSIS — E785 Hyperlipidemia, unspecified: Secondary | ICD-10-CM

## 2019-05-18 DIAGNOSIS — E1165 Type 2 diabetes mellitus with hyperglycemia: Secondary | ICD-10-CM

## 2019-05-18 DIAGNOSIS — Z23 Encounter for immunization: Secondary | ICD-10-CM | POA: Diagnosis not present

## 2019-05-18 LAB — POCT GLYCOSYLATED HEMOGLOBIN (HGB A1C): Hemoglobin A1C: 6.9 % — AB (ref 4.0–5.6)

## 2019-05-18 MED ORDER — SILDENAFIL CITRATE 100 MG PO TABS: 50.0000 mg | ORAL_TABLET | ORAL | 11 refills | Status: DC | PRN

## 2019-05-18 MED FILL — SILDENAFIL CITRATE 100 MG T: 100 | 30 days supply | Qty: 6 | Fill #0

## 2019-05-18 NOTE — Patient Instructions (Signed)
Plan:  For diabetes -  STOP metformin RESTART Jardiance CONTINUE Januvia  For high cholesterol -  Rechecking labs today  For shoulder -  Xray today  Follow up with sports medicine (Dr Georgina Snell or Dr T) if needed - call for appointment   Rx printed for ED medication Let me know if this is too expensive, or not working

## 2019-05-18 NOTE — Progress Notes (Signed)
HPI: Johnathan Warner is a 53 y.o. male who  has a past medical history of Diabetes mellitus without complication ([place]), Hyperlipemia, and Hypertension.  he presents to [facility] today, 05/18/19,  for chief complaint of:  Follow up DM2  Shoulder pain ED  DIABETES SCREENING/PREVENTIVE CARE: A1C past 3-6 mos: Yes  controlled? Yes   04/2018: 7.4  02/26/19: 6.9  Today 05/18/19: has been on Metformin and Januvia, hasn't been taking the Jardiance  BP goal <130/80: close! LDL goal <70: close at 79 last check 02/2019 Eye exam annually: none on file, importance discussed with patient Foot exam: No  Microalbuminuria:n/a on ARB Metformin: Yes but he'd like to stop this d/t frequent loose stool.  ACE/ARB: Yes  Antiplatelet if ASCVD Risk >10%: Yes  Statin: Yes  Pneumovax: Has declined    Shoulder pain: R shoulder pain, limited ROM abduction, ongoing about 3 weeks, no injury, OTC meds helping for pain.   ED: requesting medication     At today's visit 05/18/19 ... PMH, PSH, FH reviewed and updated as needed.  Current medication list and allergy/intolerance hx reviewed and updated as needed. (See remainder of HPI, ROS, Phys Exam below)   No results found.  Results for orders placed or performed in visit on 05/18/19 (from the past 72 hour(s))  POCT HgB A1C     Status: Abnormal   Collection Time: 05/18/19  7:47 AM  Result Value Ref Range   Hemoglobin A1C 6.9 (A) 4.0 - 5.6 %   HbA1c POC (<> result, manual entry)     HbA1c, POC (prediabetic range)     HbA1c, POC (controlled diabetic range)            ASSESSMENT/PLAN: The primary encounter diagnosis was Type 2 diabetes mellitus with hyperglycemia, without long-term current use of insulin ([place]). Diagnoses of Need for varicella vaccine, Need for influenza vaccination, Chronic right shoulder pain, and Hyperlipidemia associated with type 2 diabetes mellitus ([place]) were also pertinent to this  visit.    Orders Placed This Encounter  Procedures  . DG Shoulder Right  . Varicella-zoster vaccine IM (Shingrix)  . Flu Vaccine QUAD 6+ mos PF IM (Fluarix Quad PF)  . Lipid Panel w/reflex Direct LDL  . POCT HgB A1C     Meds ordered this encounter  Medications  . sildenafil (VIAGRA) 100 MG tablet    Sig: Take 0.5-1 tablets (50-100 mg total) by mouth as needed for erectile dysfunction.    Dispense:  20 tablet    Refill:  11    Patient Instructions  Plan:  For diabetes -  STOP metformin RESTART Jardiance CONTINUE Januvia  For high cholesterol -  Rechecking labs today  For shoulder -  Xray today  Follow up with sports medicine (Dr Georgina Snell or Dr T) if needed - call for appointment   Rx printed for ED medication Let me know if this is too expensive, or not working       Follow-up plan: Return in about 3 months (around 08/17/2019) for recheck A1C / diabetes follow-up, see [place] sooner if needed! .                                                 ################################################# ################################################# ################################################# #################################################    Current Meds  Medication Sig  . aspirin EC 81 MG tablet Take  1 tablet (81 mg total) by mouth daily.  . blood glucose meter kit and supplies KIT Dispense based on patient and insurance preference. Use daily as directed. Please include lancets #100 refill 99, test strips #100 refill 99, control solution. Dx: E11.65  . Blood Glucose Monitoring Suppl (ACCU-CHEK GUIDE ME) w/Device KIT 1 Units by Does not apply route daily. Needs glucometer  . Ciclopirox 1 % shampoo Shampoo scalp 2 times per week for 4 weeks.  Leave on for 3 minutes then rinse off.  . empagliflozin (JARDIANCE) 25 MG TABS tablet Take 25 mg by mouth daily.  Marland Kitchen glucose blood test strip Use up to 4 times per day as directed  with glucometer. Disp: 100. Refill x99  . Lancets (FREESTYLE) lancets Check glucose up to 4 times daily.  . Lancets Misc. (ACCU-CHEK FASTCLIX LANCET) KIT Use as directed 1-4 times daily  . losartan (COZAAR) 50 MG tablet Take 1 tablet (50 mg total) by mouth daily.  . simvastatin (ZOCOR) 40 MG tablet Take 1 tablet (40 mg total) by mouth daily.  . sitaGLIPtin (JANUVIA) 100 MG tablet Take 1 tablet (100 mg total) by mouth daily.  . [DISCONTINUED] metFORMIN (GLUCOPHAGE-XR) 500 MG 24 hr tablet Take 2 tablets (1,000 mg total) by mouth 2 (two) times daily.    Allergies  Allergen Reactions  . Sulfa Antibiotics Other (See Comments)    Hot flashes photodermatitis   . Amlodipine Other (See Comments)    umknown  . Atorvastatin Other (See Comments)  . Celecoxib Other (See Comments)    photodermatitis  . Hydrochlorothiazide Other (See Comments)    photodermatitis  . Lisinopril Other (See Comments)    Cough  . Simvastatin Other (See Comments)    Inc LFT's  . Sulfamethoxazole     photodermatitis  . Sulfonamide Derivatives   . Tetanus Toxoid, Adsorbed Other (See Comments)    unknown        Review of Systems:  Constitutional: No recent illness  HEENT: No  headache, no vision change  Cardiac: No  chest pain, No  pressure, No palpitations  Respiratory:  No  shortness of breath. No  Cough  Gastrointestinal: No  abdominal pain  Musculoskeletal: No new myalgia/arthralgia  Skin: No  Rash  Psychiatric: No  concerns with depression, No  concerns with anxiety  Exam:  BP 136/82 (BP Location: Left Arm, Patient Position: Sitting, Cuff Size: Normal)   Pulse 80   Temp 98.2 F (36.8 C) (Oral)   Wt 168 lb (76.2 kg)   BMI 27.12 kg/m   Constitutional: VS see above. General Appearance: alert, well-developed, well-nourished, NAD  Eyes: Normal lids and conjunctive, non-icteric sclera  Ears, Nose, Mouth, Throat: MMM, Normal external inspection ears/nares/mouth/lips/gums.  Neck: No masses,  trachea midline.   Respiratory: Normal respiratory effort. no wheeze, no rhonchi, no rales  Cardiovascular: S1/S2 normal, no murmur, no rub/gallop auscultated. RRR.   Musculoskeletal: Gait normal. Symmetric and independent movement of all extremities. Pain w/ abduction of R shoulder, but cross-arm, apley scratch, apprehension tests negative   Neurological: Normal balance/coordination. No tremor.  Skin: warm, dry, intact.   Psychiatric: Normal judgment/insight. Normal mood and affect. Oriented x3.       Visit summary with medication list and pertinent instructions was printed for patient to review, patient was advised to alert [place] if any updates are needed. All questions at time of visit were answered - patient instructed to contact office with any additional concerns. ER/RTC precautions were reviewed with the patient and understanding  verbalized.   Note: Total time spent 25 minutes, greater than 50% of the visit was spent face-to-face counseling and coordinating care for the following: The primary encounter diagnosis was Type 2 diabetes mellitus with hyperglycemia, without long-term current use of insulin ([place]). Diagnoses of Need for varicella vaccine, Need for influenza vaccination, Chronic right shoulder pain, and Hyperlipidemia associated with type 2 diabetes mellitus ([place]) were also pertinent to this visit.Marland Kitchen  Please note: voice recognition software was used to produce this document, and typos may escape review. Please contact Dr. Sheppard Coil for any needed clarifications.    Follow up plan: Return in about 3 months (around 08/17/2019) for recheck A1C / diabetes follow-up, see [place] sooner if needed! Marland Kitchen

## 2019-05-19 ENCOUNTER — Other Ambulatory Visit: Payer: Self-pay | Admitting: Osteopathic Medicine

## 2019-05-19 LAB — LIPID PANEL W/REFLEX DIRECT LDL
Cholesterol: 172 mg/dL (ref <200)
HDL: 30 mg/dL — ABNORMAL LOW (ref >=40)
LDL Cholesterol (Calc): 91 mg/dL (calc)
Non-HDL Cholesterol (Calc): 142 mg/dL (calc) — ABNORMAL HIGH (ref <130)
Total CHOL/HDL Ratio: 5.7 (calc) — ABNORMAL HIGH (ref <5.0)
Triglycerides: 382 mg/dL — ABNORMAL HIGH (ref <150)

## 2019-05-24 MED FILL — LOSARTAN POTASSIUM 50 MG TA: 50 | 90 days supply | Qty: 90 | Fill #0

## 2019-07-17 MED FILL — ACCU-CHEK FASTCLIX LANCETS: 26 days supply | Qty: 102 | Fill #0

## 2019-07-17 MED FILL — JANUVIA 100 MG TABLET: 100 | 90 days supply | Qty: 90 | Fill #0

## 2019-07-17 MED FILL — ACCU-CHEK GUIDE TEST STRIP: 25 days supply | Qty: 100 | Fill #0

## 2019-08-17 ENCOUNTER — Ambulatory Visit: Payer: No Typology Code available for payment source | Admitting: Osteopathic Medicine

## 2019-08-18 ENCOUNTER — Telehealth: Payer: Self-pay

## 2019-08-18 DIAGNOSIS — E1165 Type 2 diabetes mellitus with hyperglycemia: Secondary | ICD-10-CM

## 2019-08-18 MED ORDER — BLOOD GLUCOSE MONITOR KIT: PACK | 99 refills | Status: DC

## 2019-08-18 MED ORDER — FREESTYLE LANCETS MISC: 99 refills | Status: DC

## 2019-08-18 NOTE — Telephone Encounter (Signed)
As per [place] effectively 09/03/2019, [facility] will no longer cover Accu-Chek meter or supplies. Freestyle meter are the preferred formulary. Requesting new rxs for Freestyle meter and supplies for pt.

## 2019-08-18 NOTE — Telephone Encounter (Signed)
Is already an order for freestyle, I can go ahead and send it.

## 2019-08-19 MED FILL — ACCU-CHEK GUIDE TEST STRIP: 90 days supply | Qty: 400 | Fill #1

## 2019-08-19 MED FILL — ACCU-CHEK FASTCLIX LANCETS: 90 days supply | Qty: 408 | Fill #1

## 2019-08-25 MED FILL — SIMVASTATIN 40 MG TABLET: 40 | 90 days supply | Qty: 90 | Fill #0

## 2019-08-25 MED FILL — LOSARTAN POTASSIUM 50 MG TA: 50 | 90 days supply | Qty: 90 | Fill #0

## 2019-08-25 MED FILL — metFORMIN HCL ER 500 MG TB2: 500 | 90 days supply | Qty: 360 | Fill #0

## 2019-10-15 ENCOUNTER — Ambulatory Visit: Payer: No Typology Code available for payment source | Admitting: Osteopathic Medicine

## 2019-11-03 IMAGING — DX DG SHOULDER 2+V*R*
3 series · 3 of 3 positions shown · non-contrast
Comparison: None.

CLINICAL DATA: Right shoulder pain, no known injury

EXAM:
RIGHT SHOULDER - 2+ VIEW

[shoulder grashey]
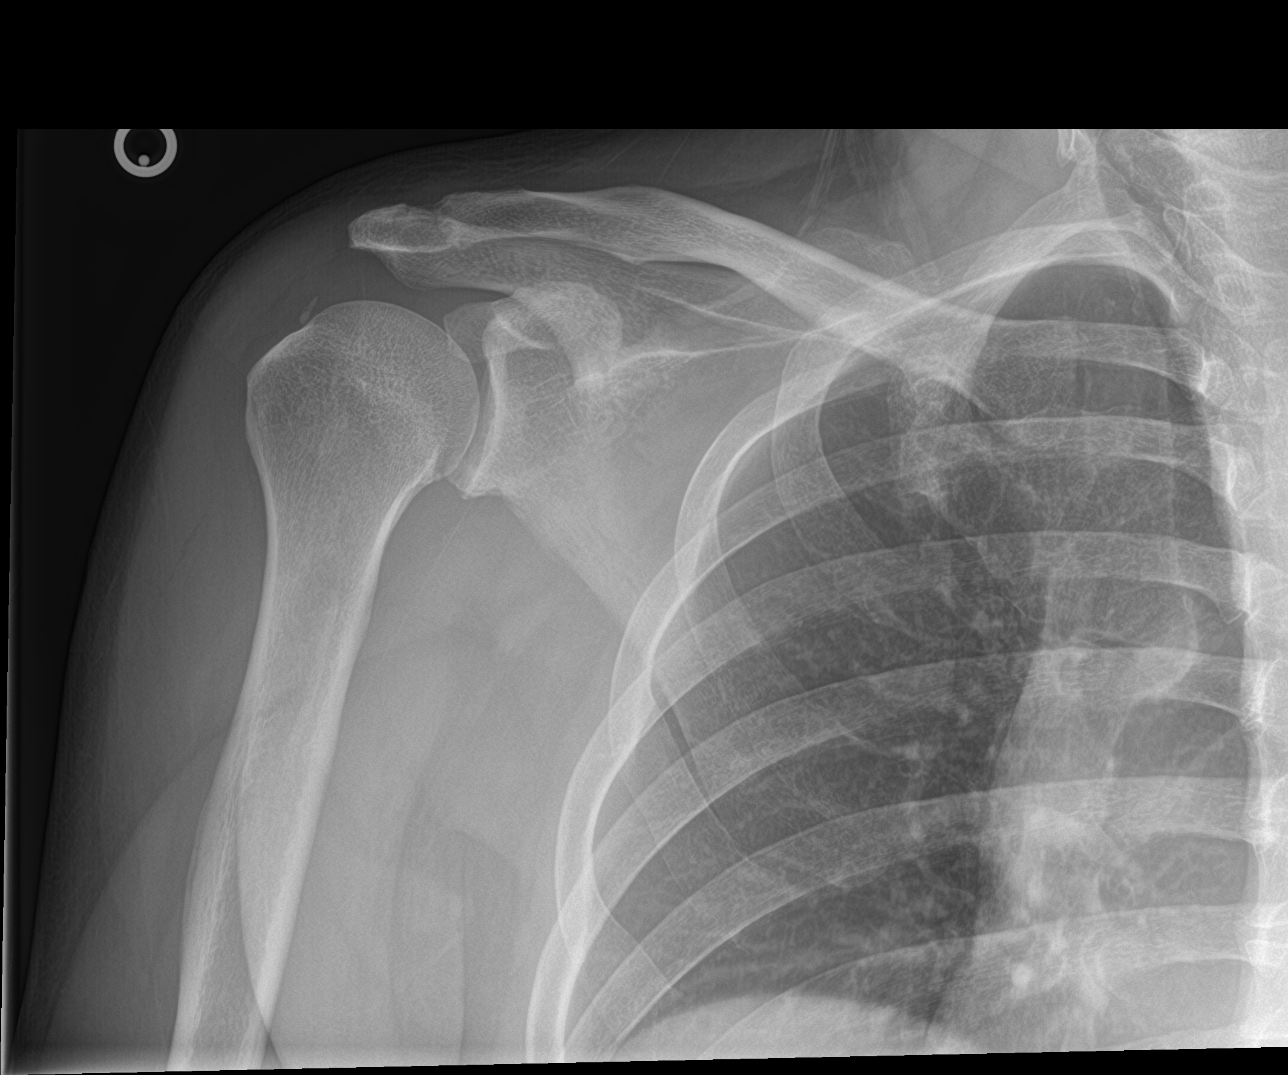

[shoulder y view]
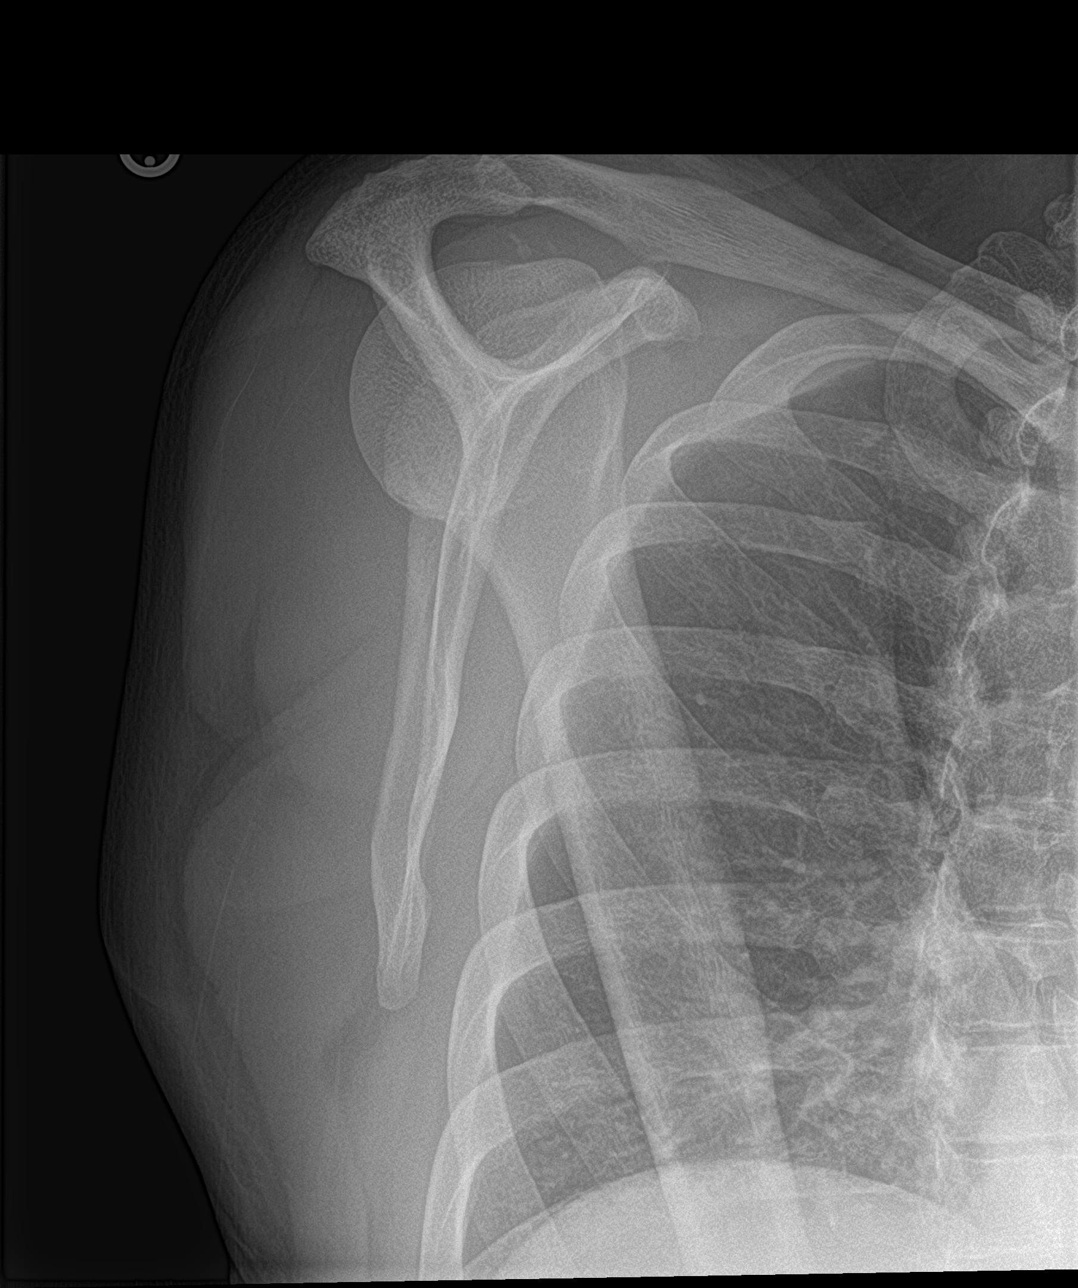

[shoulder axillary]
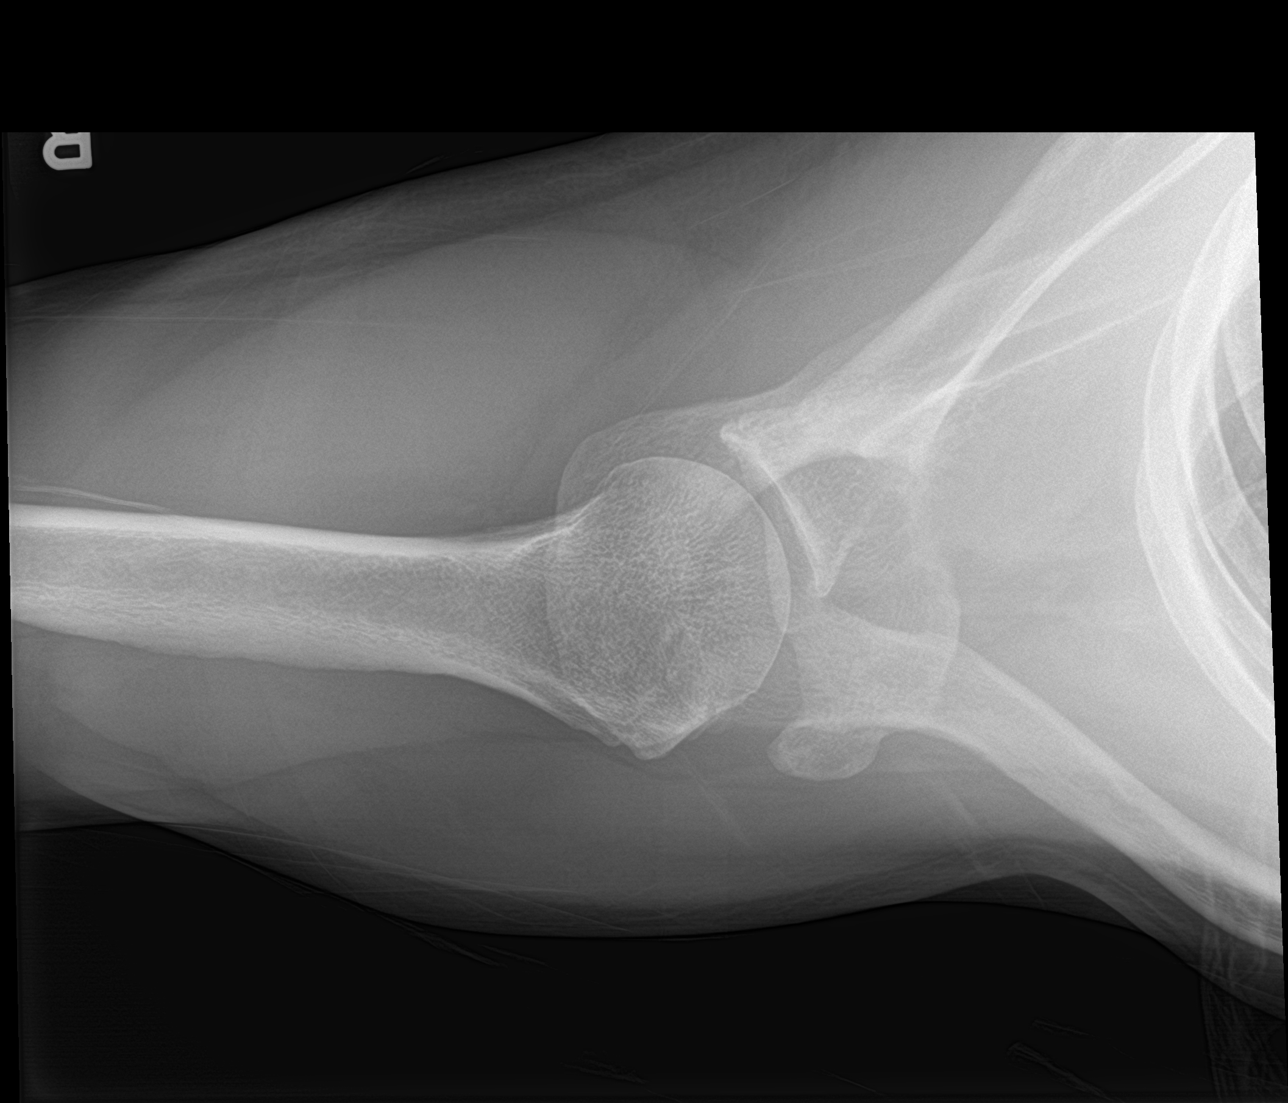

[3 of 3 positions shown; findings below may reference images not displayed]

FINDINGS: No fracture or dislocation of the right shoulder. There is mild
acromioclavicular and glenohumeral arthrosis. There are calcific
densities about the humeral head in the vicinity of the rotator cuff
insertion. Partially imaged right chest is unremarkable.
IMPRESSION: 1.  No fracture or dislocation of the right shoulder.

2.  Mild acromioclavicular and glenohumeral arthrosis.

3. There are calcific densities about the humeral head in the
vicinity of the rotator cuff insertion, in keeping with calcific
tendonitis/chronic rotator cuff pathology.

## 2019-11-15 MED FILL — METFORMIN HCL ER 500 MG TB2: 500 | 90 days supply | Qty: 360 | Fill #0

## 2019-11-15 MED FILL — SIMVASTATIN 40 MG TABLET: 40 | 90 days supply | Qty: 90 | Fill #0

## 2019-11-15 MED FILL — LOSARTAN POTASSIUM 50 MG TA: 50 | 90 days supply | Qty: 90 | Fill #0

## 2019-11-15 MED FILL — JANUVIA 100 MG TABLET: 100 | 30 days supply | Qty: 30 | Fill #0

## 2019-11-15 MED FILL — JARDIANCE 25 MG TABLET: 25 | 90 days supply | Qty: 90 | Fill #1

## 2019-11-19 ENCOUNTER — Ambulatory Visit: Payer: No Typology Code available for payment source | Admitting: Osteopathic Medicine

## 2019-12-24 ENCOUNTER — Ambulatory Visit: Payer: No Typology Code available for payment source | Admitting: Osteopathic Medicine

## 2020-02-17 MED FILL — SIMVASTATIN 40 MG TABLET: 40 | 90 days supply | Qty: 90 | Fill #1

## 2020-02-17 MED FILL — JARDIANCE 25 MG TABLET: 25 | 90 days supply | Qty: 90 | Fill #2

## 2020-02-17 MED FILL — METFORMIN HCL ER 500 MG TB2: 500 | 90 days supply | Qty: 360 | Fill #1

## 2020-02-17 MED FILL — LOSARTAN POTASSIUM 50 MG TA: 50 | 90 days supply | Qty: 90 | Fill #1

## 2020-02-17 MED FILL — JANUVIA 100 MG TABLET: 100 | 30 days supply | Qty: 30 | Fill #1

## 2020-02-18 ENCOUNTER — Ambulatory Visit: Payer: No Typology Code available for payment source | Admitting: Osteopathic Medicine

## 2020-03-10 ENCOUNTER — Other Ambulatory Visit: Payer: Self-pay

## 2020-03-10 ENCOUNTER — Other Ambulatory Visit: Payer: Self-pay | Admitting: Osteopathic Medicine

## 2020-03-10 DIAGNOSIS — E1165 Type 2 diabetes mellitus with hyperglycemia: Secondary | ICD-10-CM

## 2020-03-10 MED ORDER — FREESTYLE LANCETS MISC: 99 refills | Status: DC

## 2020-03-10 NOTE — Telephone Encounter (Signed)
Patient called into office. Left voicemail asking for refill of lancents. Due to fast talking and heavy accent. I was unable to refill other medical supplies request.

## 2020-03-10 NOTE — Addendum Note (Signed)
Addended by: Delfino Lovett on: 03/10/2020 05:03 PM   Modules accepted: Orders

## 2020-03-10 NOTE — Telephone Encounter (Signed)
Pt's wife left a msg with Triage. Pt's insurance will no longer covered the Accu-chek meter/supplies. Per wife - requesting Free style Lite. Rx pended.

## 2020-03-13 MED ORDER — BLOOD GLUCOSE METER KIT: PACK | 0 refills | Status: DC

## 2020-03-13 MED FILL — FREESTYLE LITE METER: 20 days supply | Qty: 1 | Fill #0

## 2020-03-13 MED FILL — FREESTYLE LANCETS: 25 days supply | Qty: 100 | Fill #0

## 2020-03-13 MED FILL — FREESTYLE LITE TEST STRIP: 25 days supply | Qty: 100 | Fill #0

## 2020-03-13 NOTE — Telephone Encounter (Signed)
The pharmacy is saying the patient has to use freestyle Lite products. He needs test strips. Could not find them in epic. Please advise

## 2020-03-13 NOTE — Telephone Encounter (Signed)
Task completed. Rx was given to medical assistant (Crystal L.) to faxed to pt's pharmacy.

## 2020-03-31 ENCOUNTER — Ambulatory Visit (INDEPENDENT_AMBULATORY_CARE_PROVIDER_SITE_OTHER): Payer: No Typology Code available for payment source | Admitting: Osteopathic Medicine

## 2020-03-31 ENCOUNTER — Other Ambulatory Visit: Payer: Self-pay

## 2020-03-31 ENCOUNTER — Encounter: Payer: Self-pay | Admitting: Osteopathic Medicine

## 2020-03-31 ENCOUNTER — Ambulatory Visit (INDEPENDENT_AMBULATORY_CARE_PROVIDER_SITE_OTHER): Payer: No Typology Code available for payment source

## 2020-03-31 ENCOUNTER — Other Ambulatory Visit: Payer: Self-pay | Admitting: Osteopathic Medicine

## 2020-03-31 VITALS — BP 125/85 | HR 71 | Temp 97.6°F | Wt 165.0 lb

## 2020-03-31 DIAGNOSIS — E1169 Type 2 diabetes mellitus with other specified complication: Secondary | ICD-10-CM | POA: Diagnosis not present

## 2020-03-31 DIAGNOSIS — M25511 Pain in right shoulder: Secondary | ICD-10-CM

## 2020-03-31 DIAGNOSIS — Z0000 Encounter for general adult medical examination without abnormal findings: Secondary | ICD-10-CM

## 2020-03-31 DIAGNOSIS — E1165 Type 2 diabetes mellitus with hyperglycemia: Secondary | ICD-10-CM | POA: Diagnosis not present

## 2020-03-31 DIAGNOSIS — Z1211 Encounter for screening for malignant neoplasm of colon: Secondary | ICD-10-CM | POA: Diagnosis not present

## 2020-03-31 DIAGNOSIS — E785 Hyperlipidemia, unspecified: Secondary | ICD-10-CM

## 2020-03-31 DIAGNOSIS — T7840XA Allergy, unspecified, initial encounter: Secondary | ICD-10-CM

## 2020-03-31 DIAGNOSIS — G8929 Other chronic pain: Secondary | ICD-10-CM | POA: Diagnosis not present

## 2020-03-31 LAB — CBC
HCT: 48.1 % (ref 38.5–50.0)
Hemoglobin: 16 g/dL (ref 13.2–17.1)
MCH: 30.7 pg (ref 27.0–33.0)
MCHC: 33.3 g/dL (ref 32.0–36.0)
MCV: 92.1 fL (ref 80.0–100.0)
MPV: 10.2 fL (ref 7.5–12.5)
Platelets: 309 10*3/uL (ref 140–400)
RBC: 5.22 10*6/uL (ref 4.20–5.80)
RDW: 12.2 % (ref 11.0–15.0)
WBC: 7.2 10*3/uL (ref 3.8–10.8)

## 2020-03-31 LAB — COMPLETE METABOLIC PANEL WITH GFR
AG Ratio: 1.7 (calc) (ref 1.0–2.5)
ALT: 24 U/L (ref 9–46)
AST: 19 U/L (ref 10–35)
Albumin: 4.8 g/dL (ref 3.6–5.1)
Alkaline phosphatase (APISO): 74 U/L (ref 35–144)
BUN: 13 mg/dL (ref 7–25)
CO2: 28 mmol/L (ref 20–32)
Calcium: 10 mg/dL (ref 8.6–10.3)
Chloride: 101 mmol/L (ref 98–110)
Creat: 0.97 mg/dL (ref 0.70–1.33)
GFR, Est African American: 102 mL/min/{1.73_m2} (ref >=60)
GFR, Est Non African American: 88 mL/min/{1.73_m2} (ref >=60)
Globulin: 2.8 g/dL (calc) (ref 1.9–3.7)
Glucose, Bld: 164 mg/dL — ABNORMAL HIGH (ref 65–99)
Potassium: 4.2 mmol/L (ref 3.5–5.3)
Sodium: 140 mmol/L (ref 135–146)
Total Bilirubin: 0.6 mg/dL (ref 0.2–1.2)
Total Protein: 7.6 g/dL (ref 6.1–8.1)

## 2020-03-31 LAB — POCT GLYCOSYLATED HEMOGLOBIN (HGB A1C): Hemoglobin A1C: 6.6 % — AB (ref 4.0–5.6)

## 2020-03-31 LAB — LIPID PANEL
Cholesterol: 133 mg/dL (ref <200)
HDL: 25 mg/dL — ABNORMAL LOW (ref >=40)
LDL Cholesterol (Calc): 74 mg/dL (calc)
Non-HDL Cholesterol (Calc): 108 mg/dL (calc) (ref <130)
Total CHOL/HDL Ratio: 5.3 (calc) — ABNORMAL HIGH (ref <5.0)
Triglycerides: 255 mg/dL — ABNORMAL HIGH (ref <150)

## 2020-03-31 MED ORDER — SILDENAFIL CITRATE 100 MG PO TABS: 50.0000 mg | ORAL_TABLET | ORAL | 11 refills | Status: AC | PRN

## 2020-03-31 MED ORDER — SITAGLIPTIN PHOSPHATE 100 MG PO TABS: 100.0000 mg | ORAL_TABLET | Freq: Every day | ORAL | 3 refills | Status: DC

## 2020-03-31 MED ORDER — METHYLPREDNISOLONE SODIUM SUCC 125 MG IJ SOLR
125.0000 mg | Freq: Once | INTRAMUSCULAR | Status: AC
  Administered 2020-03-31: 125 mg via INTRAMUSCULAR

## 2020-03-31 MED ORDER — EMPAGLIFLOZIN 25 MG PO TABS: 25.0000 mg | ORAL_TABLET | Freq: Every day | ORAL | 3 refills | Status: DC

## 2020-03-31 MED ORDER — LOSARTAN POTASSIUM 50 MG PO TABS: 50.0000 mg | ORAL_TABLET | Freq: Every day | ORAL | 3 refills | Status: DC

## 2020-03-31 MED FILL — JANUVIA 100 MG TABLET: 100 | 30 days supply | Qty: 30 | Fill #0

## 2020-03-31 NOTE — Progress Notes (Signed)
Johnathan Warner is a 54 y.o. male who presents to  [facility]  today, 03/31/20, seeking care for the following:  . Annual physical  . DM2 recheck  . Shoulder pain - chronic, previous arthritis . Rash - unknown exposure, possible poison ivy       ASSESSMENT & PLAN with other pertinent findings:  The primary encounter diagnosis was Annual physical exam. Diagnoses of Type 2 diabetes mellitus with hyperglycemia, without long-term current use of insulin ([place]), Hyperlipidemia associated with type 2 diabetes mellitus ([place]), Chronic right shoulder pain, Allergic reaction, initial encounter, and Colon cancer screening were also pertinent to this visit.   No results found for this or any previous visit (from the past 24 hour(s)).     Patient Instructions  General Preventive Care  Most recent routine screening labs: ordered today.   Blood pressure goal 130/80 or less.   Tobacco: don't!  Alcohol: responsible moderation is ok for most adults - if you have concerns about your alcohol intake, please talk to me!   Exercise: as tolerated to reduce risk of cardiovascular disease and diabetes. Strength training will also prevent osteoporosis.   Mental health: if need for mental health care (medicines, counseling, other), or concerns about moods, please let me know!   Sexual / Reproductive health: if need for STD testing, or if concerns with libido/pain problems, please let me know!   Advanced Directive: Living Will and/or Healthcare Power of Attorney recommended for all adults, regardless of age or health.  Vaccines  Flu vaccine: for almost everyone, every fall.   Shingles vaccine: all done!   Pneumonia vaccine: recommended for all diabetics, booster after age 33,  Tetanus booster: every 10 years   COVID vaccine: THANKS for getting your vaccine! :)  Cancer screenings   Colon cancer screening: for everyone age 80-75. Colonoscopy  available for all, many people also qualify for the Cologuard stool test   Prostate cancer screening: PSA blood test age 81-71  Lung cancer screening: not needed since quit >15 years ago Infection screenings  . HIV: recommended screening at least once age 63-65, more often as needed. . Gonorrhea/Chlamydia: screening as needed, though many insurances require testing for anyone on birth control pills. . Hepatitis C: recommended once for everyone age 81-75 . TB: certain at-risk populations, or depending on work requirements and/or travel history Other . Bone Density Test: recommended for men at age 70, sooner depending on risk factors . Abdominal Aortic Aneurysm: screening with ultrasound recommended once for men age 29-75 who have ever smoked    Orders Placed This Encounter  Procedures  . DG Shoulder Right  . CBC  . COMPLETE METABOLIC PANEL WITH GFR  . Lipid panel  . Cologuard  . POCT HgB A1C    Meds ordered this encounter  Medications  . empagliflozin (JARDIANCE) 25 MG TABS tablet    Sig: Take 1 tablet (25 mg total) by mouth daily.    Dispense:  90 tablet    Refill:  3  . losartan (COZAAR) 50 MG tablet    Sig: Take 1 tablet (50 mg total) by mouth daily.    Dispense:  90 tablet    Refill:  3  . sitaGLIPtin (JANUVIA) 100 MG tablet    Sig: Take 1 tablet (100 mg total) by mouth daily.    Dispense:  90 tablet    Refill:  3  . sildenafil (VIAGRA) 100 MG tablet    Sig: Take  0.5-1 tablets (50-100 mg total) by mouth as needed for erectile dysfunction.    Dispense:  10 tablet    Refill:  11  . methylPREDNISolone sodium succinate (SOLU-MEDROL) 125 mg/2 mL injection 125 mg    Constitutional:  . VSS, see nurse notes . General Appearance: alert, well-developed, well-nourished, NAD Eyes: Marland Kitchen Normal lids and conjunctive, non-icteric sclera Ears, Nose, Mouth, Throat: . Normal external auditory canal and TM bilaterally Neck: . No masses, trachea midline . No thyroid  enlargement/tenderness/mass appreciated Respiratory: . Normal respiratory effort . Breath sounds normal, no wheeze/rhonchi/rales Cardiovascular: . S1/S2 normal, no murmur/rub/gallop auscultated . No lower extremity edema Gastrointestinal: . Nontender, no masses . No hepatomegaly, no splenomegaly . No hernia appreciated Musculoskeletal:  . Gait normal . No clubbing/cyanosis of digits Neurological: . No cranial nerve deficit on limited exam . Motor and sensation intact and symmetric Psychiatric: . Normal judgment/insight . Normal mood and affect    Follow-up instructions: Return in about 6 months (around 10/01/2020) for MONITOR A1C, SEE [place] SOONER IF NEEDED .                                         BP 125/85 (BP Location: Left Arm, Patient Position: Sitting, Cuff Size: Normal)   Pulse 71   Temp 97.6 F (36.4 C) (Oral)   Wt 165 lb (74.8 kg)   BMI 26.63 kg/m   Current Meds  Medication Sig  . aspirin EC 81 MG tablet Take 1 tablet (81 mg total) by mouth daily.  . empagliflozin (JARDIANCE) 25 MG TABS tablet Take 1 tablet (25 mg total) by mouth daily.  Marland Kitchen losartan (COZAAR) 50 MG tablet Take 1 tablet (50 mg total) by mouth daily.  . sildenafil (VIAGRA) 100 MG tablet Take 0.5-1 tablets (50-100 mg total) by mouth as needed for erectile dysfunction.  . sitaGLIPtin (JANUVIA) 100 MG tablet Take 1 tablet (100 mg total) by mouth daily.  . [DISCONTINUED] blood glucose meter kit and supplies KIT Dispense based on patient and insurance preference. Use daily as directed. Please include lancets #100 refill 99, test strips #100 refill 99, control solution.  Dispense per insurance/patient preference Dx: E11.65 Fax to [employer]  . [DISCONTINUED] blood glucose meter kit and supplies Dispense based on patient and insurance preference. Use up to four times daily as directed. (FOR ICD-10 E10.9, E11.9).  . [DISCONTINUED] Blood Glucose Monitoring Suppl (ACCU-CHEK  GUIDE ME) w/Device KIT 1 Units by Does not apply route daily. Needs glucometer  . [DISCONTINUED] Ciclopirox 1 % shampoo Shampoo scalp 2 times per week for 4 weeks.  Leave on for 3 minutes then rinse off.  . [DISCONTINUED] empagliflozin (JARDIANCE) 25 MG TABS tablet Take 25 mg by mouth daily.  . [DISCONTINUED] glucose blood (FREESTYLE LITE) test strip Use as instructed 1-4 times daily.  . [DISCONTINUED] Lancets (FREESTYLE) lancets Check glucose up to 4 times daily.  . [DISCONTINUED] Lancets Misc. (ACCU-CHEK FASTCLIX LANCET) KIT Use as directed 1-4 times daily  . [DISCONTINUED] losartan (COZAAR) 50 MG tablet Take 1 tablet (50 mg total) by mouth daily.  . [DISCONTINUED] sildenafil (VIAGRA) 100 MG tablet Take 0.5-1 tablets (50-100 mg total) by mouth as needed for erectile dysfunction.  . [DISCONTINUED] sitaGLIPtin (JANUVIA) 100 MG tablet Take 1 tablet (100 mg total) by mouth daily.    No results found for this or any previous visit (from the past 72 hour(s)).  No  results found.     All questions at time of visit were answered - patient instructed to contact office with any additional concerns or updates.  ER/RTC precautions were reviewed with the patient as applicable.   Please note: voice recognition software was used to produce this document, and typos may escape review. Please contact Dr. Sheppard Coil for any needed clarifications.   Total encounter time on problem-based portion of visit: 20 minutes.

## 2020-03-31 NOTE — Patient Instructions (Addendum)
General Preventive Care  Most recent routine screening labs: ordered today.   Blood pressure goal 130/80 or less.   Tobacco: don't!  Alcohol: responsible moderation is ok for most adults - if you have concerns about your alcohol intake, please talk to me!   Exercise: as tolerated to reduce risk of cardiovascular disease and diabetes. Strength training will also prevent osteoporosis.   Mental health: if need for mental health care (medicines, counseling, other), or concerns about moods, please let me know!   Sexual / Reproductive health: if need for STD testing, or if concerns with libido/pain problems, please let me know!   Advanced Directive: Living Will and/or Healthcare Power of Attorney recommended for all adults, regardless of age or health.  Vaccines  Flu vaccine: for almost everyone, every fall.   Shingles vaccine: all done!   Pneumonia vaccine: recommended for all diabetics, booster after age 66,  Tetanus booster: every 10 years   COVID vaccine: THANKS for getting your vaccine! :)  Cancer screenings   Colon cancer screening: for everyone age 51-75. Colonoscopy available for all, many people also qualify for the Cologuard stool test   Prostate cancer screening: PSA blood test age 71-71  Lung cancer screening: not needed since quit >15 years ago Infection screenings   HIV: recommended screening at least once age 41-65, more often as needed.  Gonorrhea/Chlamydia: screening as needed, though many insurances require testing for anyone on birth control pills.  Hepatitis C: recommended once for everyone age 32-75  TB: certain at-risk populations, or depending on work requirements and/or travel history Other  Bone Density Test: recommended for men at age 67, sooner depending on risk factors  Abdominal Aortic Aneurysm: screening with ultrasound recommended once for men age 20-75 who have ever smoked

## 2020-04-09 ENCOUNTER — Emergency Department
Admission: EM | Admit: 2020-04-09 | Discharge: 2020-04-09 | Disposition: A | Discharge status: Home / Self Care | Payer: No Typology Code available for payment source | Source: Home / Self Care

## 2020-04-09 ENCOUNTER — Other Ambulatory Visit: Payer: Self-pay

## 2020-04-09 ENCOUNTER — Encounter: Payer: Self-pay | Admitting: Emergency Medicine

## 2020-04-09 DIAGNOSIS — T148XXA Other injury of unspecified body region, initial encounter: Secondary | ICD-10-CM

## 2020-04-09 DIAGNOSIS — L298 Other pruritus: Secondary | ICD-10-CM

## 2020-04-09 DIAGNOSIS — B86 Scabies: Secondary | ICD-10-CM | POA: Diagnosis not present

## 2020-04-09 MED ORDER — TRIAMCINOLONE ACETONIDE 0.1 % EX CREA
1.0000 | TOPICAL_CREAM | Freq: Two times a day (BID) | CUTANEOUS | 0 refills | Status: DC
Start: 2020-04-09 — End: 2024-04-07

## 2020-04-09 MED ORDER — HYDROXYZINE HCL 25 MG PO TABS: 25.0000 mg | ORAL_TABLET | Freq: Four times a day (QID) | ORAL | 0 refills | Status: DC | PRN

## 2020-04-09 MED ORDER — DEXAMETHASONE SODIUM PHOSPHATE 10 MG/ML IJ SOLN
10.0000 mg | Freq: Once | INTRAMUSCULAR | Status: AC
  Administered 2020-04-09: 10 mg via INTRAMUSCULAR

## 2020-04-09 MED ORDER — PREDNISONE 20 MG PO TABS: ORAL_TABLET | ORAL | 0 refills | Status: DC

## 2020-04-09 MED ORDER — PERMETHRIN 5 % EX CREA
TOPICAL_CREAM | CUTANEOUS | 0 refills | Status: DC
Start: 2020-04-09 — End: 2021-05-02

## 2020-04-09 NOTE — ED Provider Notes (Signed)
[facility]    CSN: [id number] Arrival date & time: 04/09/20  1336      History   Chief Complaint Chief Complaint  Patient presents with  . Rash    HPI Johnathan Warner is a 54 y.o. male.   HPI  Johnathan Warner is a 54 y.o. male presenting to UC with c/o itchy red dry rash on arms, upper chest and legs that started 1 week ago after working in his yard. He received a steroid shot last week, improved temporarily. Itching much worse at night. No sick contacts. No new soaps, lotions or medications. Denies fever or chills.   Past Medical History:  Diagnosis Date  . Diabetes mellitus without complication (HCC)   . Hyperlipemia   . Hypertension     Patient Active Problem List   Diagnosis Date Noted  . Otitis externa 06/05/2018  . History of dizziness 05/28/2018  . Tachycardia with heart rate 100-120 beats per minute 05/28/2018  . Type 2 diabetes mellitus with hyperglycemia, without long-term current use of insulin (HCC) 10/23/2016  . Essential hypertension 10/23/2016  . Hyperlipidemia associated with type 2 diabetes mellitus (HCC) 10/23/2016    History reviewed. No pertinent surgical history.     Home Medications    Prior to Admission medications   Medication Sig Start Date End Date Taking? Authorizing Provider  aspirin EC 81 MG tablet Take 1 tablet (81 mg total) by mouth daily. 02/26/19  Yes [name], DO  empagliflozin (JARDIANCE) 25 MG TABS tablet Take 1 tablet (25 mg total) by mouth daily. 03/31/20  Yes [name], DO  losartan (COZAAR) 50 MG tablet Take 1 tablet (50 mg total) by mouth daily. 03/31/20  Yes [name], DO  metFORMIN (GLUCOPHAGE-XR) 500 MG 24 hr tablet Take 1,000 mg by mouth 2 (two) times daily. 02/17/20  Yes [provider]  sildenafil (VIAGRA) 100 MG tablet Take 0.5-1 tablets (50-100 mg total) by mouth as needed for erectile dysfunction. 03/31/20 03/31/21 Yes Alexander, Dorene Grebe, DO  sitaGLIPtin (JANUVIA) 100 MG tablet  Take 1 tablet (100 mg total) by mouth daily. 03/31/20  Yes [name], DO  hydrOXYzine (ATARAX/VISTARIL) 25 MG tablet Take 1 tablet (25 mg total) by mouth every 6 (six) hours as needed for itching. 04/09/20   Lurene Shadow, PA-C  permethrin (ELIMITE) 5 % cream Apply from head to toe, avoid face. Leave on 8-14 hours, rinse thoroughly 04/09/20   Lurene Shadow, PA-C  predniSONE (DELTASONE) 20 MG tablet 3 tabs po day one, then 2 po daily x 4 days 04/09/20   Lurene Shadow, PA-C  simvastatin (ZOCOR) 40 MG tablet Take 40 mg by mouth daily. 02/17/20   [provider]  triamcinolone cream (KENALOG) 0.1 % Apply 1 application topically 2 (two) times daily. 04/09/20   Lurene Shadow, PA-C    Family History Family History  Problem Relation Age of Onset  . Cancer Mother   . Stroke Father   . Hypertension Father     Social History Social History   Tobacco Use  . Smoking status: Former Smoker    Quit date: 1994    Years since quitting: 27.6  . Smokeless tobacco: Never Used  Vaping Use  . Vaping Use: Never used  Substance Use Topics  . Alcohol use: No  . Drug use: Not on file     Allergies   Sulfa antibiotics; Amlodipine; Atorvastatin; Celecoxib; Hydrochlorothiazide; Lisinopril; Simvastatin; Sulfamethoxazole; Sulfonamide derivatives; and Tetanus toxoid, adsorbed   Review of Systems  Review of Systems  HENT: Negative for facial swelling.   Gastrointestinal: Negative for nausea and vomiting.  Musculoskeletal: Negative for arthralgias and joint swelling.  Skin: Positive for rash.     Physical Exam Triage Vital Signs ED Triage Vitals  Enc Vitals Group     BP 04/09/20 1359 111/76     Pulse Rate 04/09/20 1359 (!) 105     Resp 04/09/20 1359 16     Temp 04/09/20 1359 99 F (37.2 C)     Temp Source 04/09/20 1359 Oral     SpO2 04/09/20 1359 96 %     Weight --      Height --      Head Circumference --      Peak Flow --      Pain Score 04/09/20 1400 3     Pain Loc --       Pain Edu? --      Excl. in GC? --    No data found.  Updated Vital Signs BP 111/76 (BP Location: Left Arm)   Pulse (!) 105   Temp 99 F (37.2 C) (Oral)   Resp 16   SpO2 96%   Visual Acuity Right Eye Distance:   Left Eye Distance:   Bilateral Distance:    Right Eye Near:   Left Eye Near:    Bilateral Near:     Physical Exam Vitals and nursing note reviewed.  Constitutional:      General: He is not in acute distress.    Appearance: Normal appearance. He is well-developed. He is not ill-appearing, toxic-appearing or diaphoretic.  HENT:     Head: Normocephalic and atraumatic.  Cardiovascular:     Rate and Rhythm: Regular rhythm. Tachycardia present.  Pulmonary:     Effort: Pulmonary effort is normal. No respiratory distress.     Breath sounds: Normal breath sounds.  Musculoskeletal:        General: Normal range of motion.     Cervical back: Normal range of motion.  Skin:    General: Skin is warm and dry.     Findings: Erythema and rash present.          Comments: Diffuse erythematous papular rash on arms, legs, upper chest, lower abdomen, and Right side of neck. Areas of excoriation.  60mm erythematous papular in web spaces of fingers  Neurological:     Mental Status: He is alert and oriented to person, place, and time.  Psychiatric:        Behavior: Behavior normal.      UC Treatments / Results  Labs (all labs ordered are listed, but only abnormal results are displayed) Labs Reviewed - No data to display  EKG   Radiology No results found.  Procedures Procedures (including critical care time)  Medications Ordered in UC Medications  dexamethasone (DECADRON) injection 10 mg (10 mg Intramuscular Given 04/09/20 1423)    Initial Impression / Assessment and Plan / UC Course  I have reviewed the triage vital signs and the nursing notes.  Pertinent labs & imaging results that were available during my care of the patient were reviewed by me and considered in  my medical decision making (see chart for details).     Hx and exam c/w scabies Will tx symptomatically with steroids and atarax but also tx scabies with permethrin cream F/u with PCP as needed  Final Clinical Impressions(s) / UC Diagnoses   Final diagnoses:  Scabies  Excoriation  Pruritic erythematous rash  Discharge Instructions      You were given a shot of decadron (a steroid) today to help with itching and swelling from a likely allergic reaction.  You have been prescribed 5 days of prednisone, an oral steroid.  You may start this medication tomorrow with breakfast.    Atarax (hydroxizine) is an antihistamine that can be taken to help with itching. This medication can cause drowsiness so do not drive or drink alcohol while taking.     Follow up with family medicine in 3-4 days if not improving.       ED Prescriptions    Medication Sig Dispense Auth. Provider   hydrOXYzine (ATARAX/VISTARIL) 25 MG tablet Take 1 tablet (25 mg total) by mouth every 6 (six) hours as needed for itching. 12 tablet Doroteo Glassman, Duward Allbritton O, PA-C   predniSONE (DELTASONE) 20 MG tablet 3 tabs po day one, then 2 po daily x 4 days 11 tablet Doroteo Glassman, Campbell Agramonte O, PA-C   triamcinolone cream (KENALOG) 0.1 % Apply 1 application topically 2 (two) times daily. 30 g Doroteo Glassman, Everlyn Farabaugh O, PA-C   permethrin (ELIMITE) 5 % cream Apply from head to toe, avoid face. Leave on 8-14 hours, rinse thoroughly 60 g Lurene Shadow, [place]     PDMP not reviewed this encounter.   Lurene Shadow, [place] 04/10/20 1739

## 2020-04-09 NOTE — Discharge Instructions (Signed)
  You were given a shot of decadron (a steroid) today to help with itching and swelling from a likely allergic reaction.  You have been prescribed 5 days of prednisone, an oral steroid.  You may start this medication tomorrow with breakfast.    Atarax (hydroxizine) is an antihistamine that can be taken to help with itching. This medication can cause drowsiness so do not drive or drink alcohol while taking.     Follow up with family medicine in 3-4 days if not improving.

## 2020-04-09 NOTE — ED Triage Notes (Signed)
Pt seen on July 30th  by PCP (Dr Lyn Hollingshead) for rash to his arms Py had been out in yard Presents today w/ red raised rash to arms  & legs Pt's wife has been giving him benadryl, also sent a list of medicines she would like him to have or be prescribed in urgent care Pt's wife is a NP Covid Vaccine - Gala Murdoch

## 2020-04-20 ENCOUNTER — Ambulatory Visit: Payer: No Typology Code available for payment source | Admitting: Sports Medicine

## 2020-06-03 ENCOUNTER — Other Ambulatory Visit: Payer: Self-pay | Admitting: Osteopathic Medicine

## 2020-06-03 MED FILL — LOSARTAN POTASSIUM 50 MG TA: 50 | 90 days supply | Qty: 90 | Fill #0

## 2020-06-03 MED FILL — JARDIANCE 25 MG TABLET: 25 | 90 days supply | Qty: 90 | Fill #0

## 2020-06-05 ENCOUNTER — Other Ambulatory Visit: Payer: Self-pay | Admitting: Osteopathic Medicine

## 2020-06-05 MED FILL — SIMVASTATIN 40 MG TABLET: 40 | 90 days supply | Qty: 90 | Fill #0

## 2020-06-05 MED FILL — METFORMIN HCL ER 500 MG TB2: 500 | 90 days supply | Qty: 360 | Fill #0

## 2020-06-24 ENCOUNTER — Other Ambulatory Visit (HOSPITAL_COMMUNITY): Payer: Self-pay | Admitting: Internal Medicine

## 2020-06-24 MED FILL — FLUARIX QUADRIVALENT 0.5 ML: 0.5 | 1 days supply | Qty: 1 | Fill #0

## 2020-07-21 ENCOUNTER — Encounter: Payer: No Typology Code available for payment source | Admitting: Osteopathic Medicine

## 2020-08-22 ENCOUNTER — Ambulatory Visit (INDEPENDENT_AMBULATORY_CARE_PROVIDER_SITE_OTHER): Payer: No Typology Code available for payment source | Admitting: Osteopathic Medicine

## 2020-08-22 ENCOUNTER — Other Ambulatory Visit: Payer: Self-pay | Admitting: Osteopathic Medicine

## 2020-08-22 ENCOUNTER — Other Ambulatory Visit: Payer: Self-pay

## 2020-08-22 ENCOUNTER — Encounter: Payer: Self-pay | Admitting: Osteopathic Medicine

## 2020-08-22 VITALS — BP 133/90 | HR 81 | Temp 98.0°F | Wt 168.8 lb

## 2020-08-22 DIAGNOSIS — R42 Dizziness and giddiness: Secondary | ICD-10-CM | POA: Diagnosis not present

## 2020-08-22 DIAGNOSIS — S161XXA Strain of muscle, fascia and tendon at neck level, initial encounter: Secondary | ICD-10-CM | POA: Diagnosis not present

## 2020-08-22 DIAGNOSIS — E1165 Type 2 diabetes mellitus with hyperglycemia: Secondary | ICD-10-CM | POA: Diagnosis not present

## 2020-08-22 DIAGNOSIS — R0789 Other chest pain: Secondary | ICD-10-CM

## 2020-08-22 DIAGNOSIS — H60331 Swimmer's ear, right ear: Secondary | ICD-10-CM

## 2020-08-22 LAB — POCT GLYCOSYLATED HEMOGLOBIN (HGB A1C): Hemoglobin A1C: 7.6 % — AB (ref 4.0–5.6)

## 2020-08-22 MED ORDER — HYDROCORTISONE-ACETIC ACID 1-2 % OT SOLN
3.0000 [drp] | Freq: Three times a day (TID) | OTIC | 0 refills | Status: DC
Start: 2020-08-22 — End: 2020-08-22

## 2020-08-22 MED FILL — HYDROCORTISON-ACETIC ACID S: 1-2 | 30 days supply | Qty: 10 | Fill #0

## 2020-08-22 NOTE — Progress Notes (Signed)
Johnathan Warner is a 54 y.o. male who presents to  [facility]  today, 08/22/20, seeking care for the following:  . DM2 monitoring - A1C up a bit today from 6.6 4 mos ago, is 7.6 now. Not monitoring Glc at home . Soreness R upper neck / occipital area. No lymphadenopathy or obvious muscle spasm on exam, normal active ROM neck w/ rotation, ext/flex.  . Reports ear pain in canal on R, is a swimmer. Canal appears slightly red but on R compared to L no TM abnormality  . Dizziness - feeling "heavy headed" 1-2 hours after eating, no LOC, no presyncope. No light-headedness or spinning sensation. Has not measured Glc when this happens. No SOB, no HA/VC.  Marland Kitchen Chest discomfort on occasion - points to upper L chest and into L shoulder. RRR, WNL S1S2 no additional heart sounds. Denies CP on exertion. No SOB.  Marland Kitchen      ASSESSMENT & PLAN with other pertinent findings:  The primary encounter diagnosis was Type 2 diabetes mellitus with hyperglycemia, without long-term current use of insulin (HCC). Diagnoses of Chest discomfort, Dizziness, Strain of neck muscle, initial encounter, and Acute swimmer's ear of right side were also pertinent to this visit.     1. Type 2 diabetes mellitus with hyperglycemia, without long-term current use of insulin (HCC) A1C above goal but not dramatically so. ROom for improvement in diet/exercise, continue current meds for now   2. Chest discomfort No CP on exertion Pending labs, may consider exercise tolerance test / cardiology referral  EKG interpretation: Rate: 74  Rhythm: sinus No ST/T changes concerning for acute ischemia/infarct  Previous EKG 06/05/2018 - no interim change   3. Dizziness Nonspecific heavy-headedness reported fairly frequently, only occurs a few hours after eating, doesn't sound c/w vertigo, orthostatic symptoms, hypoglycemia, hypo/hypertension. Pt advised check Glc and BP w/ these episodes. Keep  log of when these episodes occur, associated meal/time, etc. May be postprandial abnormal Glc? Low threshold for endocrinology referral based on home Glc.   4. Strain of neck muscle, initial encounter Advised neck stretching at home, consider PT/imaging   5. Acute swimmer's ear of right side Drops sent    Results for orders placed or performed in visit on 08/22/20 (from the past 24 hour(s))  POCT HgB A1C     Status: Abnormal   Collection Time: 08/22/20  7:34 AM  Result Value Ref Range   Hemoglobin A1C 7.6 (A) 4.0 - 5.6 %   HbA1c POC (<> result, manual entry)     HbA1c, POC (prediabetic range)     HbA1c, POC (controlled diabetic range)         There are no Patient Instructions on file for this visit.  Orders Placed This Encounter  Procedures  . Urine Culture  . Urinalysis, Routine w reflex microscopic  . CBC with Differential/Platelet  . COMPLETE METABOLIC PANEL WITH GFR  . TSH  . Lipid panel  . PSA, Total with Reflex to PSA, Free  . POCT HgB A1C  . EKG 12-Lead    Meds ordered this encounter  Medications  . acetic acid-hydrocortisone (VOSOL-HC) OTIC solution    Sig: Place 3 drops into both ears 3 (three) times daily. For one week than once prn after swimming    Dispense:  10 mL    Refill:  0       Follow-up instructions: Return for RECHECK PENDING RESULTS / IF WORSE OR CHANGE. ROUTINE A1C CHECK  IN 4 MOS. .                                         BP 133/90 (BP Location: Left Arm, Patient Position: Sitting, Cuff Size: Normal)   Pulse 81   Temp 98 F (36.7 C) (Oral)   Wt 168 lb 12.8 oz (76.6 kg)   SpO2 96%   BMI 27.25 kg/m   Current Meds  Medication Sig  . aspirin EC 81 MG tablet Take 1 tablet (81 mg total) by mouth daily.  . empagliflozin (JARDIANCE) 25 MG TABS tablet Take 1 tablet (25 mg total) by mouth daily.  . hydrOXYzine (ATARAX/VISTARIL) 25 MG tablet Take 1 tablet (25 mg total) by mouth every 6 (six) hours as  needed for itching.  . losartan (COZAAR) 50 MG tablet Take 1 tablet (50 mg total) by mouth daily.  . metFORMIN (GLUCOPHAGE-XR) 500 MG 24 hr tablet TAKE 2 TABLETS BY MOUTH 2 TIMES DAILY.  Marland Kitchen permethrin (ELIMITE) 5 % cream Apply from head to toe, avoid face. Leave on 8-14 hours, rinse thoroughly  . predniSONE (DELTASONE) 20 MG tablet 3 tabs po day one, then 2 po daily x 4 days  . sildenafil (VIAGRA) 100 MG tablet Take 0.5-1 tablets (50-100 mg total) by mouth as needed for erectile dysfunction.  . simvastatin (ZOCOR) 40 MG tablet TAKE 1 TABLET BY MOUTH DAILY.  . sitaGLIPtin (JANUVIA) 100 MG tablet Take 1 tablet (100 mg total) by mouth daily.  Marland Kitchen triamcinolone cream (KENALOG) 0.1 % Apply 1 application topically 2 (two) times daily.    Results for orders placed or performed in visit on 08/22/20 (from the past 72 hour(s))  POCT HgB A1C     Status: Abnormal   Collection Time: 08/22/20  7:34 AM  Result Value Ref Range   Hemoglobin A1C 7.6 (A) 4.0 - 5.6 %   HbA1c POC (<> result, manual entry)     HbA1c, POC (prediabetic range)     HbA1c, POC (controlled diabetic range)      No results found.     All questions at time of visit were answered - patient instructed to contact office with any additional concerns or updates.  ER/RTC precautions were reviewed with the patient as applicable.   Please note: voice recognition software was used to produce this document, and typos may escape review. Please contact Dr. Lyn Hollingshead for any needed clarifications.

## 2020-08-23 LAB — CBC WITH DIFFERENTIAL/PLATELET
Absolute Monocytes: 429 cells/uL (ref 200–950)
Basophils Absolute: 92 cells/uL (ref 0–200)
Basophils Relative: 1.4 %
Eosinophils Absolute: 271 cells/uL (ref 15–500)
Eosinophils Relative: 4.1 %
HCT: 49.4 % (ref 38.5–50.0)
Hemoglobin: 16.6 g/dL (ref 13.2–17.1)
Lymphs Abs: 2350 cells/uL (ref 850–3900)
MCH: 31 pg (ref 27.0–33.0)
MCHC: 33.6 g/dL (ref 32.0–36.0)
MCV: 92.3 fL (ref 80.0–100.0)
MPV: 10.7 fL (ref 7.5–12.5)
Monocytes Relative: 6.5 %
Neutro Abs: 3458 cells/uL (ref 1500–7800)
Neutrophils Relative %: 52.4 %
Platelets: 300 10*3/uL (ref 140–400)
RBC: 5.35 10*6/uL (ref 4.20–5.80)
RDW: 11.9 % (ref 11.0–15.0)
Total Lymphocyte: 35.6 %
WBC: 6.6 10*3/uL (ref 3.8–10.8)

## 2020-08-23 LAB — URINALYSIS, ROUTINE W REFLEX MICROSCOPIC
Bacteria, UA: NONE SEEN /HPF
Bilirubin Urine: NEGATIVE
Hgb urine dipstick: NEGATIVE
Hyaline Cast: NONE SEEN /LPF
Leukocytes,Ua: NEGATIVE
Nitrite: NEGATIVE
RBC / HPF: NONE SEEN /HPF (ref 0–2)
Specific Gravity, Urine: 1.038 — ABNORMAL HIGH (ref 1.001–1.03)
Squamous Epithelial / HPF: NONE SEEN /HPF (ref <=5)
WBC, UA: NONE SEEN /HPF (ref 0–5)
pH: <=5 (ref 5.0–8.0)

## 2020-08-23 LAB — LIPID PANEL
Cholesterol: 226 mg/dL — ABNORMAL HIGH (ref <200)
HDL: 36 mg/dL — ABNORMAL LOW (ref >=40)
Non-HDL Cholesterol (Calc): 190 mg/dL (calc) — ABNORMAL HIGH (ref <130)
Total CHOL/HDL Ratio: 6.3 (calc) — ABNORMAL HIGH (ref <5.0)
Triglycerides: 446 mg/dL — ABNORMAL HIGH (ref <150)

## 2020-08-23 LAB — COMPLETE METABOLIC PANEL WITH GFR
AG Ratio: 1.6 (calc) (ref 1.0–2.5)
ALT: 36 U/L (ref 9–46)
AST: 19 U/L (ref 10–35)
Albumin: 4.6 g/dL (ref 3.6–5.1)
Alkaline phosphatase (APISO): 75 U/L (ref 35–144)
BUN: 16 mg/dL (ref 7–25)
CO2: 26 mmol/L (ref 20–32)
Calcium: 10.3 mg/dL (ref 8.6–10.3)
Chloride: 101 mmol/L (ref 98–110)
Creat: 0.88 mg/dL (ref 0.70–1.33)
GFR, Est African American: 112 mL/min/{1.73_m2} (ref >=60)
GFR, Est Non African American: 97 mL/min/{1.73_m2} (ref >=60)
Globulin: 2.9 g/dL (calc) (ref 1.9–3.7)
Glucose, Bld: 198 mg/dL — ABNORMAL HIGH (ref 65–99)
Potassium: 4.4 mmol/L (ref 3.5–5.3)
Sodium: 138 mmol/L (ref 135–146)
Total Bilirubin: 0.5 mg/dL (ref 0.2–1.2)
Total Protein: 7.5 g/dL (ref 6.1–8.1)

## 2020-08-23 LAB — URINE CULTURE
MICRO NUMBER:: 11344841
Result:: NO GROWTH
SPECIMEN QUALITY:: ADEQUATE

## 2020-08-23 LAB — PSA, TOTAL WITH REFLEX TO PSA, FREE: PSA, Total: 0.3 ng/mL (ref <=4.0)

## 2020-08-23 LAB — TSH: TSH: 1.39 mIU/L (ref 0.40–4.50)

## 2020-08-23 NOTE — Addendum Note (Signed)
Addended by: Deirdre Pippins on: 08/23/2020 06:05 PM   Modules accepted: Orders

## 2020-09-16 IMAGING — DX DG SHOULDER 2+V*R*
3 series · 3 of 3 positions shown · non-contrast
Comparison: 05/18/1999

CLINICAL DATA: Right shoulder pain

EXAM:
RIGHT SHOULDER - 2+ VIEW

[shoulder grashey]
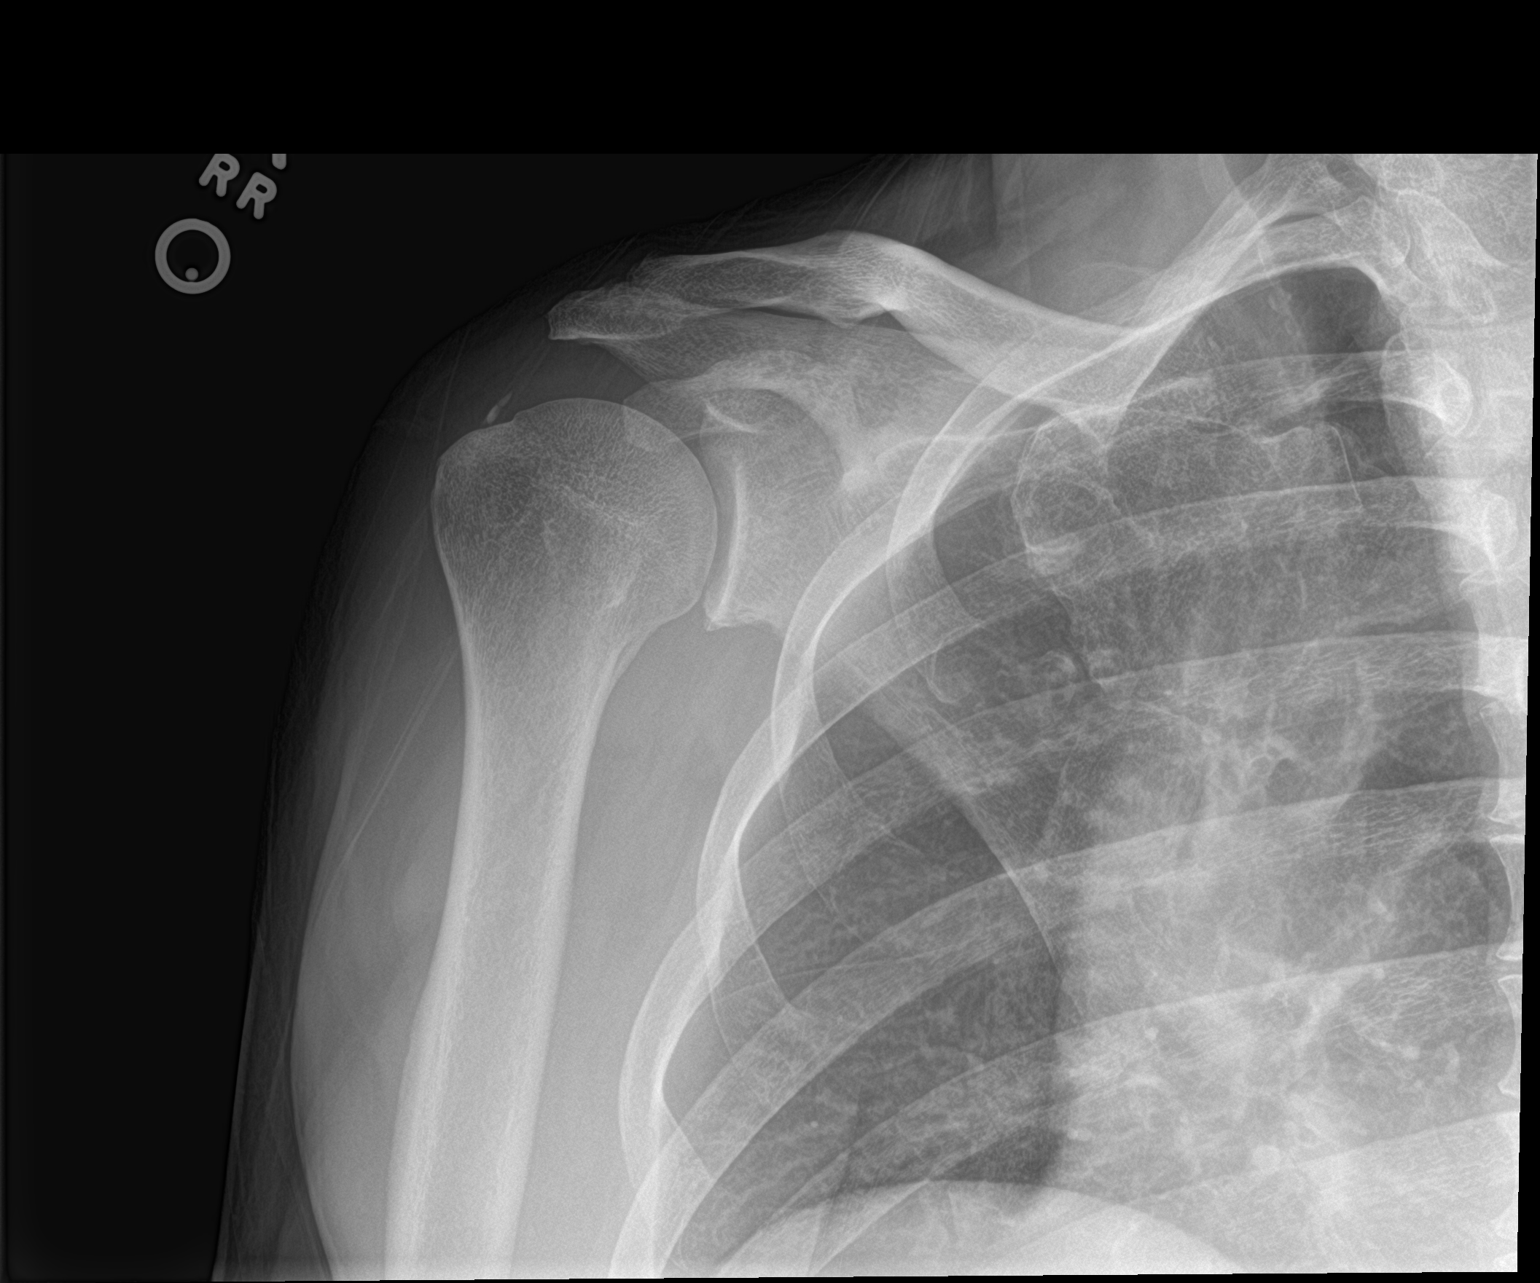

[shoulder y view]
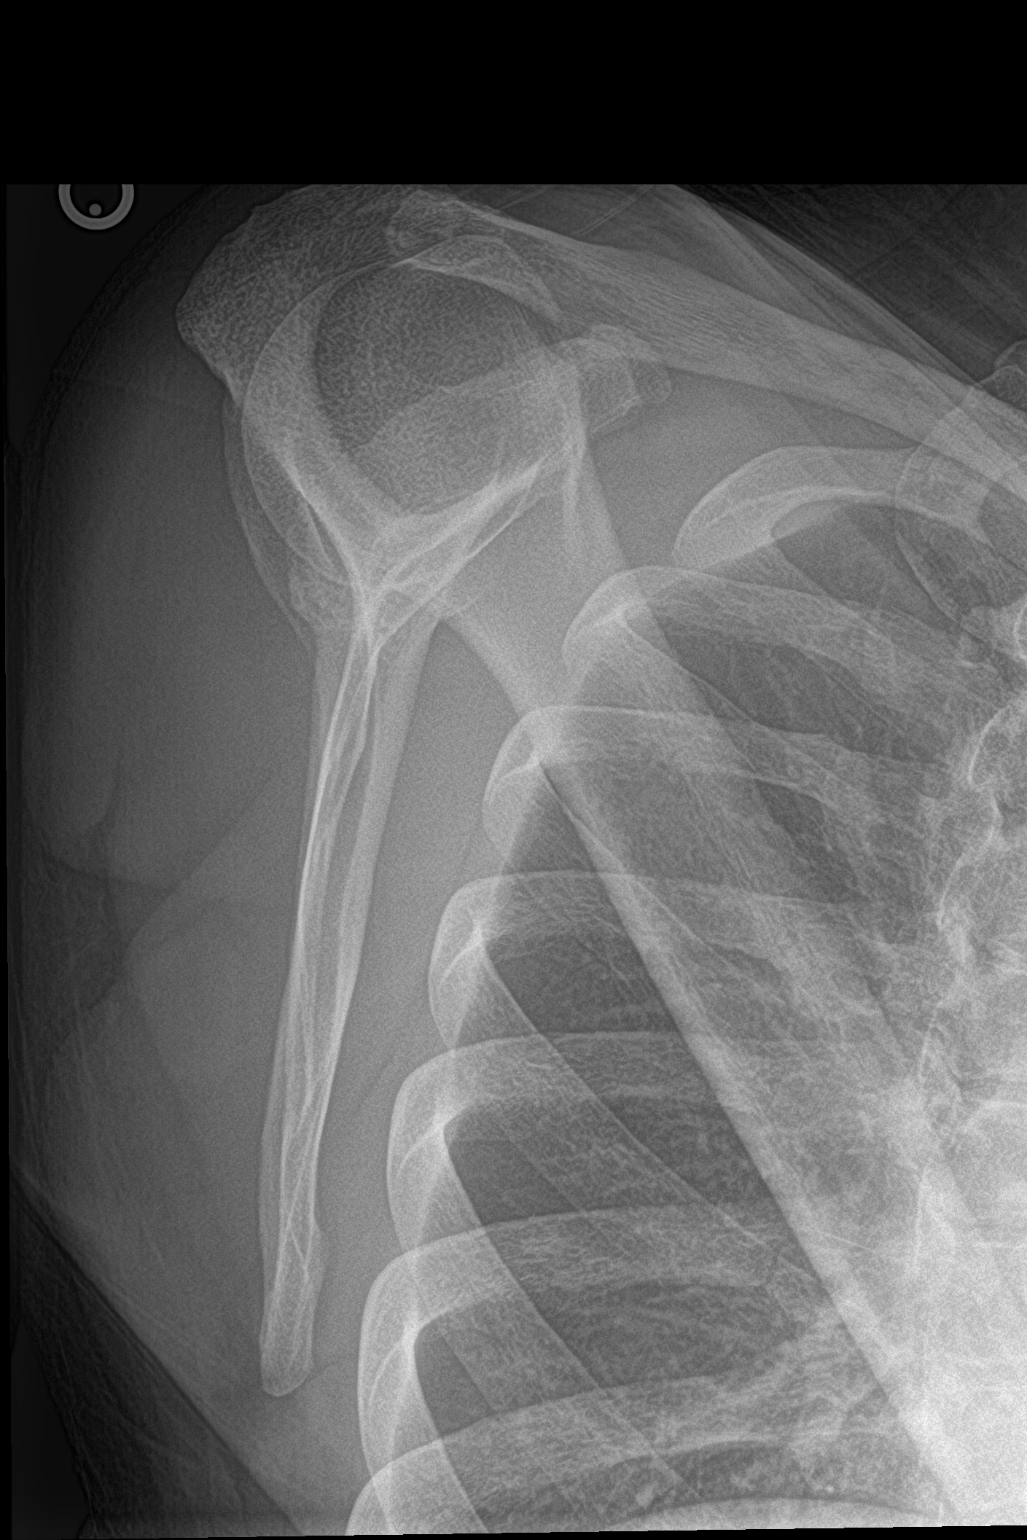

[shoulder axillary]
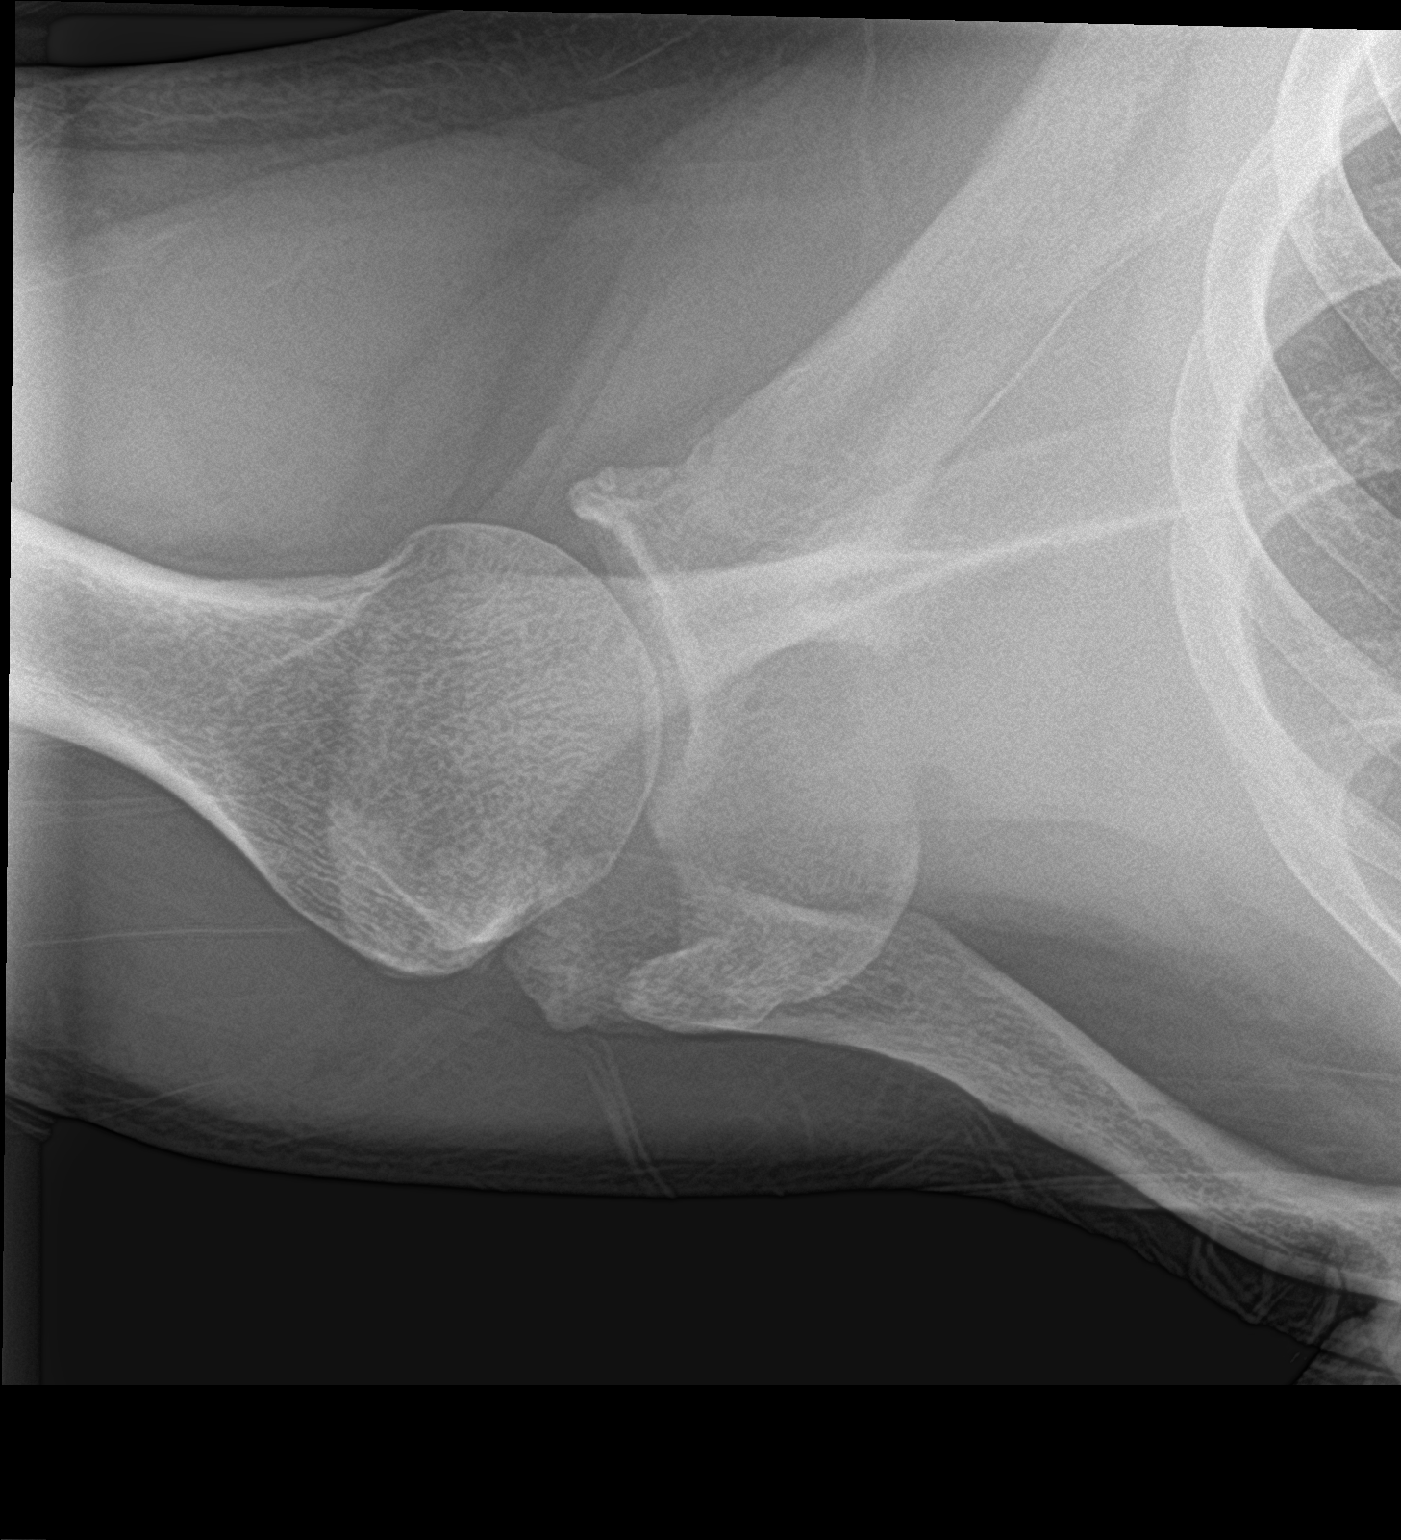

[3 of 3 positions shown; findings below may reference images not displayed]

FINDINGS: No fracture or dislocation of the right shoulder. Mild glenohumeral
and acromioclavicular arthrosis. Calcific densities about the
humeral head in the vicinity of the rotator cuff insertion,
unchanged compared to prior. The partially imaged right chest is
unremarkable.
IMPRESSION: 1. No fracture or dislocation of the right shoulder.
2. Mild glenohumeral and acromioclavicular arthrosis.
3. Calcific densities about the humeral head in the vicinity of the
rotator cuff insertion, unchanged compared to prior and again in
keeping with calcific tendonitis or chronic rotator cuff pathology.

## 2020-09-30 MED FILL — LOSARTAN POTASSIUM 50 MG TA: 50 | 90 days supply | Qty: 90 | Fill #1

## 2020-09-30 MED FILL — metFORMIN HCL ER 500 MG TB2: 500 | 90 days supply | Qty: 360 | Fill #0

## 2020-09-30 MED FILL — SIMVASTATIN 40 MG TABLET: 40 | 90 days supply | Qty: 90 | Fill #0

## 2020-09-30 MED FILL — JANUVIA 100 MG TABLET: 100 | 30 days supply | Qty: 30 | Fill #0

## 2020-10-30 ENCOUNTER — Encounter: Payer: No Typology Code available for payment source | Admitting: Osteopathic Medicine

## 2020-11-03 NOTE — Progress Notes (Deleted)
Referring-Johnathan Alexander, DO Reason for referral-chest pain  HPI: 55 year old male for evaluation of chest pain at request of Johnathan Nielsen, DO.  Current Outpatient Medications  Medication Sig Dispense Refill  . acetic acid-hydrocortisone (VOSOL-HC) OTIC solution Place 3 drops into both ears 3 (three) times daily. For one week than once prn after swimming 10 mL 0  . aspirin EC 81 MG tablet Take 1 tablet (81 mg total) by mouth daily. 90 tablet 3  . empagliflozin (JARDIANCE) 25 MG TABS tablet Take 1 tablet (25 mg total) by mouth daily. 90 tablet 3  . hydrOXYzine (ATARAX/VISTARIL) 25 MG tablet Take 1 tablet (25 mg total) by mouth every 6 (six) hours as needed for itching. 12 tablet 0  . losartan (COZAAR) 50 MG tablet Take 1 tablet (50 mg total) by mouth daily. 90 tablet 3  . metFORMIN (GLUCOPHAGE-XR) 500 MG 24 hr tablet TAKE 2 TABLETS BY MOUTH 2 TIMES DAILY. 360 tablet 1  . permethrin (ELIMITE) 5 % cream Apply from head to toe, avoid face. Leave on 8-14 hours, rinse thoroughly 60 g 0  . predniSONE (DELTASONE) 20 MG tablet 3 tabs po day one, then 2 po daily x 4 days 11 tablet 0  . sildenafil (VIAGRA) 100 MG tablet Take 0.5-1 tablets (50-100 mg total) by mouth as needed for erectile dysfunction. 10 tablet 11  . simvastatin (ZOCOR) 40 MG tablet TAKE 1 TABLET BY MOUTH DAILY. 90 tablet 1  . sitaGLIPtin (JANUVIA) 100 MG tablet Take 1 tablet (100 mg total) by mouth daily. 90 tablet 3  . triamcinolone cream (KENALOG) 0.1 % Apply 1 application topically 2 (two) times daily. 30 g 0   No current facility-administered medications for this visit.    Allergies  Allergen Reactions  . Sulfa Antibiotics Other (See Comments)    Hot flashes photodermatitis   . Amlodipine Other (See Comments)    As per pt - rash on face, hot flashes and photodermatitis.   . Atorvastatin Other (See Comments)  . Celecoxib Other (See Comments)    photodermatitis  . Hydrochlorothiazide Other (See Comments)     photodermatitis  . Lisinopril Other (See Comments)    Cough  . Simvastatin Other (See Comments)    Inc LFT's  . Sulfamethoxazole     photodermatitis  . Sulfonamide Derivatives   . Tetanus Toxoid, Adsorbed Other (See Comments)    unknown     Past Medical History:  Diagnosis Date  . Diabetes mellitus without complication (HCC)   . Hyperlipemia   . Hypertension     No past surgical history on file.  Social History   Socioeconomic History  . Marital status: Married    Spouse name: Not on file  . Number of children: Not on file  . Years of education: Not on file  . Highest education level: Not on file  Occupational History  . Not on file  Tobacco Use  . Smoking status: Former Smoker    Quit date: 1994    Years since quitting: 28.1  . Smokeless tobacco: Never Used  Vaping Use  . Vaping Use: Never used  Substance and Sexual Activity  . Alcohol use: No  . Drug use: Not on file  . Sexual activity: Yes  Other Topics Concern  . Not on file  Social History Narrative  . Not on file   Social Determinants of Health   Financial Resource Strain: Not on file  Food Insecurity: Not on file  Transportation Needs: Not on file  Physical Activity: Not on file  Stress: Not on file  Social Connections: Not on file  Intimate Partner Violence: Not on file    Family History  Problem Relation Age of Onset  . Cancer Mother   . Stroke Father   . Hypertension Father     ROS: no fevers or chills, productive cough, hemoptysis, dysphasia, odynophagia, melena, hematochezia, dysuria, hematuria, rash, seizure activity, orthopnea, PND, pedal edema, claudication. Remaining systems are negative.  Physical Exam:   There were no vitals taken for this visit.  General:  Well developed/well nourished in NAD Skin warm/dry Patient not depressed No peripheral clubbing Back-normal HEENT-normal/normal eyelids Neck supple/normal carotid upstroke bilaterally; no bruits; no JVD; no  thyromegaly chest - CTA/ normal expansion CV - RRR/normal S1 and S2; no murmurs, rubs or gallops;  PMI nondisplaced Abdomen -NT/ND, no HSM, no mass, + bowel sounds, no bruit 2+ femoral pulses, no bruits Ext-no edema, chords, 2+ DP Neuro-grossly nonfocal  ECG - personally reviewed  A/P  1 chest pain-  2 hypertension-  3 hyperlipidemia-  Olga Millers, MD

## 2020-11-15 ENCOUNTER — Ambulatory Visit: Payer: No Typology Code available for payment source | Admitting: Cardiology

## 2020-12-21 ENCOUNTER — Ambulatory Visit: Payer: No Typology Code available for payment source | Admitting: Osteopathic Medicine

## 2021-01-04 ENCOUNTER — Other Ambulatory Visit (HOSPITAL_COMMUNITY): Payer: Self-pay

## 2021-01-04 ENCOUNTER — Other Ambulatory Visit: Payer: Self-pay | Admitting: Osteopathic Medicine

## 2021-01-04 MED ORDER — SIMVASTATIN 40 MG PO TABS
ORAL_TABLET | Freq: Every day | ORAL | 0 refills | Status: DC
  Filled 2021-01-04: qty 90, 90d supply, fill #0

## 2021-01-04 MED ORDER — METFORMIN HCL ER 500 MG PO TB24
ORAL_TABLET | Freq: Two times a day (BID) | ORAL | 0 refills | Status: DC
  Filled 2021-01-04: qty 360, 90d supply, fill #0

## 2021-01-04 MED FILL — Empagliflozin Tab 25 MG: ORAL | 90 days supply | Qty: 90 | Fill #0 | Status: AC

## 2021-01-04 MED FILL — Losartan Potassium Tab 50 MG: ORAL | 90 days supply | Qty: 90 | Fill #0 | Status: AC

## 2021-01-04 MED FILL — Sitagliptin Phosphate Tab 100 MG (Base Equiv): ORAL | 30 days supply | Qty: 30 | Fill #0 | Status: AC

## 2021-01-09 NOTE — Progress Notes (Deleted)
Referring-Johnathan Alexander, DO Reason for referral-chest pain  HPI: 55 year old male for evaluation of chest pain at request of Johnathan Nielsen, DO.  Current Outpatient Medications  Medication Sig Dispense Refill  . acetic acid-hydrocortisone (VOSOL-HC) OTIC solution PLACE 3 DROPS INTO BOTH EARS 3 (THREE) TIMES DAILY FOR ONE WEEK THEN ONCE AS NEEDED AFTER SWIMMING 10 mL 0  . aspirin EC 81 MG tablet Take 1 tablet (81 mg total) by mouth daily. 90 tablet 3  . empagliflozin (JARDIANCE) 25 MG TABS tablet TAKE 1 TABLET (25 MG TOTAL) BY MOUTH DAILY. 90 tablet 3  . hydrOXYzine (ATARAX/VISTARIL) 25 MG tablet Take 1 tablet (25 mg total) by mouth every 6 (six) hours as needed for itching. 12 tablet 0  . influenza vac split quadrivalent PF (FLUARIX) 0.5 ML injection USE AS DIRECTED .5 mL 0  . losartan (COZAAR) 50 MG tablet TAKE 1 TABLET (50 MG TOTAL) BY MOUTH DAILY. 90 tablet 3  . metFORMIN (GLUCOPHAGE-XR) 500 MG 24 hr tablet TAKE 2 TABLETS BY MOUTH 2 TIMES DAILY. 360 tablet 0  . permethrin (ELIMITE) 5 % cream Apply from head to toe, avoid face. Leave on 8-14 hours, rinse thoroughly 60 g 0  . predniSONE (DELTASONE) 20 MG tablet 3 tabs po day one, then 2 po daily x 4 days 11 tablet 0  . sildenafil (VIAGRA) 100 MG tablet Take 0.5-1 tablets (50-100 mg total) by mouth as needed for erectile dysfunction. 10 tablet 11  . simvastatin (ZOCOR) 40 MG tablet TAKE 1 TABLET BY MOUTH DAILY. 90 tablet 0  . sitaGLIPtin (JANUVIA) 100 MG tablet TAKE 1 TABLET (100 MG TOTAL) BY MOUTH DAILY. 90 tablet 3  . triamcinolone cream (KENALOG) 0.1 % Apply 1 application topically 2 (two) times daily. 30 g 0   No current facility-administered medications for this visit.    Allergies  Allergen Reactions  . Sulfa Antibiotics Other (See Comments)    Hot flashes photodermatitis   . Amlodipine Other (See Comments)    As per pt - rash on face, hot flashes and photodermatitis.   . Atorvastatin Other (See Comments)  .  Celecoxib Other (See Comments)    photodermatitis  . Hydrochlorothiazide Other (See Comments)    photodermatitis  . Lisinopril Other (See Comments)    Cough  . Simvastatin Other (See Comments)    Inc LFT's  . Sulfamethoxazole     photodermatitis  . Sulfonamide Derivatives   . Tetanus Toxoid, Adsorbed Other (See Comments)    unknown     Past Medical History:  Diagnosis Date  . Diabetes mellitus without complication (HCC)   . Hyperlipemia   . Hypertension     No past surgical history on file.  Social History   Socioeconomic History  . Marital status: Married    Spouse name: Not on file  . Number of children: Not on file  . Years of education: Not on file  . Highest education level: Not on file  Occupational History  . Not on file  Tobacco Use  . Smoking status: Former Smoker    Quit date: 1994    Years since quitting: 28.3  . Smokeless tobacco: Never Used  Vaping Use  . Vaping Use: Never used  Substance and Sexual Activity  . Alcohol use: No  . Drug use: Not on file  . Sexual activity: Yes  Other Topics Concern  . Not on file  Social History Narrative  . Not on file   Social Determinants of Health  Financial Resource Strain: Not on file  Food Insecurity: Not on file  Transportation Needs: Not on file  Physical Activity: Not on file  Stress: Not on file  Social Connections: Not on file  Intimate Partner Violence: Not on file    Family History  Problem Relation Age of Onset  . Cancer Mother   . Stroke Father   . Hypertension Father     ROS: no fevers or chills, productive cough, hemoptysis, dysphasia, odynophagia, melena, hematochezia, dysuria, hematuria, rash, seizure activity, orthopnea, PND, pedal edema, claudication. Remaining systems are negative.  Physical Exam:   There were no vitals taken for this visit.  General:  Well developed/well nourished in NAD Skin warm/dry Patient not depressed No peripheral  clubbing Back-normal HEENT-normal/normal eyelids Neck supple/normal carotid upstroke bilaterally; no bruits; no JVD; no thyromegaly chest - CTA/ normal expansion CV - RRR/normal S1 and S2; no murmurs, rubs or gallops;  PMI nondisplaced Abdomen -NT/ND, no HSM, no mass, + bowel sounds, no bruit 2+ femoral pulses, no bruits Ext-no edema, chords, 2+ DP Neuro-grossly nonfocal  ECG - personally reviewed  A/P  1 chest pain-  2 hypertension-  3 hyperlipidemia-  Johnathan Millers, MD

## 2021-01-17 ENCOUNTER — Ambulatory Visit: Payer: No Typology Code available for payment source | Admitting: Cardiology

## 2021-01-30 ENCOUNTER — Other Ambulatory Visit (HOSPITAL_COMMUNITY): Payer: Self-pay

## 2021-01-30 MED ORDER — CARESTART COVID-19 HOME TEST VI KIT
PACK | 0 refills | Status: DC
  Filled 2021-01-30: qty 4, 4d supply, fill #0

## 2021-03-06 ENCOUNTER — Other Ambulatory Visit: Payer: Self-pay | Admitting: Osteopathic Medicine

## 2021-03-06 ENCOUNTER — Other Ambulatory Visit (HOSPITAL_COMMUNITY): Payer: Self-pay

## 2021-03-06 DIAGNOSIS — E1165 Type 2 diabetes mellitus with hyperglycemia: Secondary | ICD-10-CM

## 2021-03-12 ENCOUNTER — Other Ambulatory Visit (HOSPITAL_COMMUNITY): Payer: Self-pay

## 2021-03-13 ENCOUNTER — Other Ambulatory Visit (HOSPITAL_COMMUNITY): Payer: Self-pay

## 2021-03-14 ENCOUNTER — Other Ambulatory Visit: Payer: Self-pay

## 2021-03-14 ENCOUNTER — Other Ambulatory Visit (HOSPITAL_COMMUNITY): Payer: Self-pay

## 2021-03-14 DIAGNOSIS — E1165 Type 2 diabetes mellitus with hyperglycemia: Secondary | ICD-10-CM

## 2021-03-14 MED ORDER — FREESTYLE LITE TEST VI STRP
1.0000 | ORAL_STRIP | Freq: Four times a day (QID) | 0 refills | Status: AC
  Filled 2021-03-14: qty 100, 25d supply, fill #0

## 2021-03-14 MED ORDER — FREESTYLE LITE W/DEVICE KIT
1.0000 | PACK | 0 refills | Status: DC | PRN
  Filled 2021-03-14: qty 1, 1d supply, fill #0

## 2021-03-14 MED ORDER — FREESTYLE LANCETS MISC
99 refills | Status: DC
  Filled 2021-03-14: qty 100, 25d supply, fill #0

## 2021-04-16 ENCOUNTER — Encounter (HOSPITAL_BASED_OUTPATIENT_CLINIC_OR_DEPARTMENT_OTHER): Payer: Self-pay | Admitting: Nurse Practitioner

## 2021-04-16 NOTE — Progress Notes (Signed)
On Call Note: Patients wife called on-call provider 04/15/2021 at appx 1945 to report sting from yellow jacket on patients neck.  Wife reports erythema and pruritis in the area with moderate edema. No shortness of breath, airway constriction, sore throat, swelling to the tongue, lips, face, chest pain, dizziness, palpitations, fever, nausea, vomiting. Patient is alert and oriented and speaking in full sentences.  Wife reports in the past he has had to go to Urgent Care after stings for "fluids and shot".  UC closed at this time.  Sent Prednisone taper with instructions to begin taper immediately and follow-up with PCP.  Recommendations for emergency care provided and discussed with the patients wife.  Understanding expressed.

## 2021-04-19 NOTE — Progress Notes (Deleted)
Referring-[name], DO Reason for referral-chest pain  HPI: 55 year old male for evaluation of chest pain at request of Emeterio Reeve, DO.  Current Outpatient Medications  Medication Sig Dispense Refill   Blood Glucose Monitoring Suppl (FREESTYLE LITE) w/Device KIT Use as directed 1 kit 0   acetic acid-hydrocortisone (VOSOL-HC) OTIC solution PLACE 3 DROPS INTO BOTH EARS 3 (THREE) TIMES DAILY FOR ONE WEEK THEN ONCE AS NEEDED AFTER SWIMMING 10 mL 0   aspirin EC 81 MG tablet Take 1 tablet (81 mg total) by mouth daily. 90 tablet 3   COVID-19 At Home Antigen Test (CARESTART COVID-19 HOME TEST) KIT Use as directed 4 each 0   empagliflozin (JARDIANCE) 25 MG TABS tablet TAKE 1 TABLET (25 MG TOTAL) BY MOUTH DAILY. 90 tablet 3   hydrOXYzine (ATARAX/VISTARIL) 25 MG tablet Take 1 tablet (25 mg total) by mouth every 6 (six) hours as needed for itching. 12 tablet 0   influenza vac split quadrivalent PF (FLUARIX) 0.5 ML injection USE AS DIRECTED .5 mL 0   Lancets (FREESTYLE) lancets Check glucose up to 4 times daily. 100 each PRN   losartan (COZAAR) 50 MG tablet TAKE 1 TABLET (50 MG TOTAL) BY MOUTH DAILY. 90 tablet 3   metFORMIN (GLUCOPHAGE-XR) 500 MG 24 hr tablet TAKE 2 TABLETS BY MOUTH 2 TIMES DAILY. 360 tablet 0   permethrin (ELIMITE) 5 % cream Apply from head to toe, avoid face. Leave on 8-14 hours, rinse thoroughly 60 g 0   predniSONE (DELTASONE) 20 MG tablet 3 tabs po day one, then 2 po daily x 4 days 11 tablet 0   sildenafil (VIAGRA) 100 MG tablet Take 0.5-1 tablets (50-100 mg total) by mouth as needed for erectile dysfunction. 10 tablet 11   simvastatin (ZOCOR) 40 MG tablet TAKE 1 TABLET BY MOUTH DAILY. 90 tablet 0   sitaGLIPtin (JANUVIA) 100 MG tablet TAKE 1 TABLET (100 MG TOTAL) BY MOUTH DAILY. 90 tablet 3   triamcinolone cream (KENALOG) 0.1 % Apply 1 application topically 2 (two) times daily. 30 g 0   No current facility-administered medications for this visit.    Allergies   Allergen Reactions   Sulfa Antibiotics Other (See Comments)    Hot flashes photodermatitis    Amlodipine Other (See Comments)    As per pt - rash on face, hot flashes and photodermatitis.    Atorvastatin Other (See Comments)   Celecoxib Other (See Comments)    photodermatitis   Hydrochlorothiazide Other (See Comments)    photodermatitis   Lisinopril Other (See Comments)    Cough   Simvastatin Other (See Comments)    Inc LFT's   Sulfamethoxazole     photodermatitis   Sulfonamide Derivatives    Tetanus Toxoid, Adsorbed Other (See Comments)    unknown      Past Medical History:  Diagnosis Date   Diabetes mellitus without complication ([place])    Hyperlipemia    Hypertension     No past surgical history on file.  Social History   Socioeconomic History   Marital status: Married    Spouse name: Not on file   Number of children: Not on file   Years of education: Not on file   Highest education level: Not on file  Occupational History   Not on file  Tobacco Use   Smoking status: Former    Types: Cigarettes    Quit date: 67    Years since quitting: 28.6   Smokeless tobacco: Never  Vaping Use  Vaping Use: Never used  Substance and Sexual Activity   Alcohol use: No   Drug use: Not on file   Sexual activity: Yes  Other Topics Concern   Not on file  Social History Narrative   Not on file   Social Determinants of Health   Financial Resource Strain: Not on file  Food Insecurity: Not on file  Transportation Needs: Not on file  Physical Activity: Not on file  Stress: Not on file  Social Connections: Not on file  Intimate Partner Violence: Not on file    Family History  Problem Relation Age of Onset   Cancer Mother    Stroke Father    Hypertension Father     ROS: no fevers or chills, productive cough, hemoptysis, dysphasia, odynophagia, melena, hematochezia, dysuria, hematuria, rash, seizure activity, orthopnea, PND, pedal edema, claudication. Remaining  systems are negative.  Physical Exam:   There were no vitals taken for this visit.  General:  Well developed/well nourished in NAD Skin warm/dry Patient not depressed No peripheral clubbing Back-normal HEENT-normal/normal eyelids Neck supple/normal carotid upstroke bilaterally; no bruits; no JVD; no thyromegaly chest - CTA/ normal expansion CV - RRR/normal S1 and S2; no murmurs, rubs or gallops;  PMI nondisplaced Abdomen -NT/ND, no HSM, no mass, + bowel sounds, no bruit 2+ femoral pulses, no bruits Ext-no edema, chords, 2+ DP Neuro-grossly nonfocal  ECG - personally reviewed  A/P  1 Chest pain-  2 Hypertension-BP controlled; continue present meds.  3 hyperlipidemia-continue statin.  Kirk Ruths, MD

## 2021-04-20 ENCOUNTER — Telehealth: Payer: Self-pay

## 2021-04-20 ENCOUNTER — Other Ambulatory Visit: Payer: Self-pay | Admitting: Osteopathic Medicine

## 2021-04-20 ENCOUNTER — Other Ambulatory Visit (HOSPITAL_COMMUNITY): Payer: Self-pay

## 2021-04-20 NOTE — Telephone Encounter (Signed)
Pt called and left voicemail requesting refill, but he did not indicated which medication he needed refills on.  Attempted to call pt back but there was no answer and voicemail box was full.  Tiajuana Amass, CMA

## 2021-04-21 ENCOUNTER — Other Ambulatory Visit (HOSPITAL_COMMUNITY): Payer: Self-pay

## 2021-04-21 ENCOUNTER — Other Ambulatory Visit: Payer: Self-pay | Admitting: Osteopathic Medicine

## 2021-04-23 ENCOUNTER — Other Ambulatory Visit: Payer: Self-pay | Admitting: Osteopathic Medicine

## 2021-04-23 ENCOUNTER — Other Ambulatory Visit (HOSPITAL_COMMUNITY): Payer: Self-pay

## 2021-04-23 MED ORDER — EMPAGLIFLOZIN 25 MG PO TABS
25.0000 mg | ORAL_TABLET | Freq: Every day | ORAL | 0 refills | Status: DC
  Filled 2021-04-23: qty 90, 90d supply, fill #0

## 2021-04-23 MED ORDER — SITAGLIPTIN PHOSPHATE 100 MG PO TABS
100.0000 mg | ORAL_TABLET | Freq: Every day | ORAL | 0 refills | Status: DC
  Filled 2021-04-23: qty 30, 30d supply, fill #0

## 2021-04-23 MED ORDER — SIMVASTATIN 40 MG PO TABS
40.0000 mg | ORAL_TABLET | Freq: Every day | ORAL | 0 refills | Status: DC
  Filled 2021-04-23: qty 90, 90d supply, fill #0

## 2021-04-23 MED ORDER — LOSARTAN POTASSIUM 50 MG PO TABS
50.0000 mg | ORAL_TABLET | Freq: Every day | ORAL | 0 refills | Status: DC
  Filled 2021-04-23: qty 90, 90d supply, fill #0

## 2021-04-24 ENCOUNTER — Other Ambulatory Visit (HOSPITAL_COMMUNITY): Payer: Self-pay

## 2021-04-25 ENCOUNTER — Other Ambulatory Visit (HOSPITAL_COMMUNITY): Payer: Self-pay

## 2021-04-25 MED ORDER — QUICKVUE AT-HOME COVID-19 TEST VI KIT
PACK | 0 refills | Status: DC
  Filled 2021-04-25: qty 2, 2d supply, fill #0

## 2021-04-25 NOTE — Telephone Encounter (Signed)
Jardiance refilled on 04/23/2021.  Tiajuana Amass, CMA

## 2021-04-30 ENCOUNTER — Ambulatory Visit: Payer: No Typology Code available for payment source | Admitting: Cardiology

## 2021-05-02 ENCOUNTER — Other Ambulatory Visit (HOSPITAL_COMMUNITY): Payer: Self-pay

## 2021-05-02 ENCOUNTER — Encounter: Payer: Self-pay | Admitting: Family Medicine

## 2021-05-02 ENCOUNTER — Ambulatory Visit (INDEPENDENT_AMBULATORY_CARE_PROVIDER_SITE_OTHER): Payer: No Typology Code available for payment source | Admitting: Osteopathic Medicine

## 2021-05-02 ENCOUNTER — Other Ambulatory Visit: Payer: Self-pay

## 2021-05-02 VITALS — BP 127/81 | HR 79 | Temp 98.5°F | Wt 159.1 lb

## 2021-05-02 DIAGNOSIS — E1165 Type 2 diabetes mellitus with hyperglycemia: Secondary | ICD-10-CM

## 2021-05-02 DIAGNOSIS — Z23 Encounter for immunization: Secondary | ICD-10-CM | POA: Diagnosis not present

## 2021-05-02 DIAGNOSIS — Z1211 Encounter for screening for malignant neoplasm of colon: Secondary | ICD-10-CM

## 2021-05-02 DIAGNOSIS — Z125 Encounter for screening for malignant neoplasm of prostate: Secondary | ICD-10-CM

## 2021-05-02 DIAGNOSIS — Z0000 Encounter for general adult medical examination without abnormal findings: Secondary | ICD-10-CM | POA: Diagnosis not present

## 2021-05-02 LAB — POCT GLYCOSYLATED HEMOGLOBIN (HGB A1C): Hemoglobin A1C: 7.2 % — AB (ref 4.0–5.6)

## 2021-05-02 MED ORDER — GLUCOSE BLOOD VI STRP
ORAL_STRIP | 99 refills | Status: DC
  Filled 2021-05-02: qty 100, 25d supply, fill #0

## 2021-05-02 MED ORDER — FREESTYLE LANCETS MISC
99 refills | Status: DC
  Filled 2021-05-02: qty 100, 25d supply, fill #0

## 2021-05-02 MED ORDER — FREESTYLE LIBRE 14 DAY READER DEVI
99 refills | Status: DC
  Filled 2021-05-02: qty 1, 1d supply, fill #0

## 2021-05-02 MED ORDER — METFORMIN HCL ER 500 MG PO TB24
1000.0000 mg | ORAL_TABLET | Freq: Two times a day (BID) | ORAL | 1 refills | Status: DC
  Filled 2021-05-02: qty 360, 90d supply, fill #0

## 2021-05-02 MED ORDER — SIMVASTATIN 40 MG PO TABS
40.0000 mg | ORAL_TABLET | Freq: Every day | ORAL | 1 refills | Status: DC
  Filled 2021-05-02: qty 90, 90d supply, fill #0

## 2021-05-02 MED ORDER — LOSARTAN POTASSIUM 50 MG PO TABS
50.0000 mg | ORAL_TABLET | Freq: Every day | ORAL | 1 refills | Status: DC
  Filled 2021-05-02 – 2021-07-31 (×2): qty 90, 90d supply, fill #0

## 2021-05-02 MED ORDER — EMPAGLIFLOZIN 25 MG PO TABS
25.0000 mg | ORAL_TABLET | Freq: Every day | ORAL | 1 refills | Status: DC
  Filled 2021-05-02: qty 90, 90d supply, fill #0

## 2021-05-02 MED ORDER — BETAMETHASONE DIPROPIONATE 0.05 % EX CREA
TOPICAL_CREAM | Freq: Two times a day (BID) | CUTANEOUS | 1 refills | Status: AC
  Filled 2021-05-02: qty 45, 30d supply, fill #0

## 2021-05-02 MED ORDER — FREESTYLE LIBRE 14 DAY SENSOR MISC
99 refills | Status: DC
  Filled 2021-05-02: qty 2, 28d supply, fill #0

## 2021-05-02 MED ORDER — SITAGLIPTIN PHOSPHATE 100 MG PO TABS
100.0000 mg | ORAL_TABLET | Freq: Every day | ORAL | 1 refills | Status: DC
  Filled 2021-05-02: qty 90, 90d supply, fill #0
  Filled 2022-01-07: qty 30, 30d supply, fill #0

## 2021-05-02 NOTE — Patient Instructions (Addendum)
Preventive care / annual physical: Labs today  Medications refilled Cologuard for colon cancer screening  Recommend Pneumonia vaccine next visit   FYI to my patients: After six years here, I will be leaving practice at [facility]. My last day here will be 06/01/2021. I will continue to provide your care up until that date, and you will still be considered a patient here after that as long as you want to be!    You will get a letter in the mail explaining details, but after 06/01/2021, my patients have several options to continue care:  1) you can establish care with Dr. Everrett Coombe or Christen Butter NP, who are accepting new patients here and are absorbing many folks from my current patient panel, OR...  2) you can see any available provider here on as-needed basis until my official replacement starts (hiring a new doctor has not been finalized yet), OR.Marland KitchenMarland Kitchen 3) if you choose to seek care elsewhere, this office will be happy to facilitate transfer of records, and will refill medications on a case-by-case basis.   It is bittersweet to leave! 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 I have truly enjoyed taking care of folks here, but I am also excited for my next adventure doing something a bit different. Take care, and please let us know if you have any questions!   -Dr. Mervyn Skeeters.

## 2021-05-02 NOTE — Progress Notes (Signed)
Johnathan Warner is a 55 y.o. male who presents to  [place] at [facility]  today, 05/02/21, seeking care for the following:  Annual physical DM2 check - A1C today 7.2     ASSESSMENT & PLAN with other pertinent findings:  The primary encounter diagnosis was Annual physical exam. Diagnoses of Need for influenza vaccination, Type 2 diabetes mellitus with hyperglycemia, without long-term current use of insulin ([place]), Colon cancer screening, Type 2 diabetes mellitus with hyperglycemia, without long-term current use of insulin ([place]), and Prostate cancer screening were also pertinent to this visit.    Patient Instructions  Preventive care / annual physical: Labs today  Medications refilled Cologuard for colon cancer screening  Recommend Pneumonia vaccine next visit   Orders Placed This Encounter  Procedures   Flu Vaccine QUAD 6+ mos PF IM (Fluarix Quad PF)   Cologuard   Microalbumin / creatinine urine ratio   CBC   COMPLETE METABOLIC PANEL WITH GFR   Lipid panel   PSA, Total with Reflex to PSA, Free   POCT HgB A1C    Meds ordered this encounter  Medications   empagliflozin (JARDIANCE) 25 MG TABS tablet    Sig: Take 1 tablet (25 mg total) by mouth daily.    Dispense:  90 tablet    Refill:  1   losartan (COZAAR) 50 MG tablet    Sig: Take 1 tablet (50 mg total) by mouth daily.    Dispense:  90 tablet    Refill:  1   metFORMIN (GLUCOPHAGE-XR) 500 MG 24 hr tablet    Sig: Take 2 tablets (1,000 mg total) by mouth 2 (two) times daily.    Dispense:  360 tablet    Refill:  1   simvastatin (ZOCOR) 40 MG tablet    Sig: Take 1 tablet (40 mg total) by mouth daily.    Dispense:  90 tablet    Refill:  1   sitaGLIPtin (JANUVIA) 100 MG tablet    Sig: Take 1 tablet (100 mg total) by mouth daily.    Dispense:  90 tablet    Refill:  1   Lancets (FREESTYLE) lancets    Sig: Check glucose up to 4 times daily.    Dispense:  100 each    Refill:   PRN   Continuous Blood Gluc Receiver (FREESTYLE LIBRE 14 DAY READER) DEVI    Sig: Use as directed    Dispense:  1 each    Refill:  99   Continuous Blood Gluc Sensor (FREESTYLE LIBRE 14 DAY SENSOR) MISC    Sig: Use as directed    Dispense:  6 each    Refill:  99   glucose blood test strip    Sig: Use up to 4 times daily to test blood sugar.    Dispense:  100 strip    Refill:  99   betamethasone dipropionate 0.05 % cream    Sig: Apply topically 2 (two) times daily to affected eczema area(s) as needed    Dispense:  45 g    Refill:  1     See below for relevant physical exam findings  See below for recent lab and imaging results reviewed  Medications, allergies, PMH, PSH, SocH, FamH reviewed below    Follow-up instructions: Return in about 6 months (around 10/30/2021) for A1C MONITOR w/ JOY OR DR MATTHEWS . Pt aware I will be leaving practice.  Exam:  BP 127/81 (BP Location: Left Arm, Patient Position: Sitting, Cuff Size: Normal)   Pulse 79   Temp 98.5 F (36.9 C) (Oral)   Wt 159 lb 1.9 oz (72.2 kg)   BMI 25.68 kg/m  Constitutional: VS see above. General Appearance: alert, well-developed, well-nourished, NAD Neck: No masses, trachea midline.  Respiratory: Normal respiratory effort. no wheeze, no rhonchi, no rales Cardiovascular: S1/S2 normal, no murmur, no rub/gallop auscultated. RRR.  Musculoskeletal: Gait normal. Symmetric and independent movement of all extremities Abdominal: non-tender, non-distended, no appreciable organomegaly, neg Murphy's, BS [id number] Neurological: Normal balance/coordination. No tremor. Skin: warm, dry, intact.  Psychiatric: Normal judgment/insight. Normal mood and affect. Oriented x3.   Current Meds  Medication Sig   acetic acid-hydrocortisone (VOSOL-HC) OTIC solution PLACE 3 DROPS INTO BOTH EARS 3 (THREE) TIMES DAILY FOR ONE WEEK THEN ONCE AS NEEDED AFTER SWIMMING    aspirin EC 81 MG tablet Take 1 tablet (81 mg total) by mouth daily.   betamethasone dipropionate 0.05 % cream Apply topically 2 (two) times daily to affected eczema area(s) as needed   Blood Glucose Monitoring Suppl (FREESTYLE LITE) w/Device KIT Use as directed   Continuous Blood Gluc Receiver (FREESTYLE LIBRE 14 DAY READER) DEVI Use as directed   Continuous Blood Gluc Sensor (FREESTYLE LIBRE 14 DAY SENSOR) MISC Use as directed   COVID-19 At Home Antigen Test (QUICKVUE AT-HOME COVID-19 TEST) KIT Use as directed   glucose blood test strip Use up to 4 times daily to test blood sugar.   influenza vac split quadrivalent PF (FLUARIX) 0.5 ML injection USE AS DIRECTED   triamcinolone cream (KENALOG) 0.1 % Apply 1 application topically 2 (two) times daily.   [DISCONTINUED] empagliflozin (JARDIANCE) 25 MG TABS tablet Take 1 tablet (25 mg total) by mouth daily.   [DISCONTINUED] Lancets (FREESTYLE) lancets Check glucose up to 4 times daily.   [DISCONTINUED] losartan (COZAAR) 50 MG tablet Take 1 tablet (50 mg total) by mouth daily.   [DISCONTINUED] metFORMIN (GLUCOPHAGE-XR) 500 MG 24 hr tablet TAKE 2 TABLETS BY MOUTH 2 TIMES DAILY.   [DISCONTINUED] simvastatin (ZOCOR) 40 MG tablet Take 1 tablet (40 mg total) by mouth daily.   [DISCONTINUED] sitaGLIPtin (JANUVIA) 100 MG tablet Take 1 tablet (100 mg total) by mouth daily.    Allergies  Allergen Reactions   Sulfa Antibiotics Other (See Comments)    Hot flashes photodermatitis    Amlodipine Other (See Comments)    As per pt - rash on face, hot flashes and photodermatitis.    Atorvastatin Other (See Comments)   Celecoxib Other (See Comments)    photodermatitis   Hydrochlorothiazide Other (See Comments)    photodermatitis   Lisinopril Other (See Comments)    Cough   Simvastatin Other (See Comments)    Inc LFT's   Sulfamethoxazole     photodermatitis   Sulfonamide Derivatives    Tetanus Toxoid, Adsorbed Other (See Comments)    unknown      Patient Active Problem List   Diagnosis Date Noted   Otitis externa 06/05/2018   History of dizziness 05/28/2018   Tachycardia with heart rate 100-120 beats per minute 05/28/2018   Type 2 diabetes mellitus with hyperglycemia, without long-term current use of insulin ([place]) 10/23/2016   Essential hypertension 10/23/2016   Hyperlipidemia associated with type 2 diabetes mellitus ([place]) 10/23/2016    Family History  Problem Relation Age of Onset   Cancer Mother    Stroke Father    Hypertension Father     Social  History   Tobacco Use  Smoking Status Former   Types: Cigarettes   Quit date: 1994   Years since quitting: 28.6  Smokeless Tobacco Never    No past surgical history on file.  Immunization History  Administered Date(s) Administered   Influenza,inj,Quad PF,6+ Mos 05/18/2019, 06/24/2020, 05/02/2021   Zoster Recombinat (Shingrix) 02/26/2019, 05/18/2019    Recent Results (from the past 2160 hour(s))  POCT HgB A1C     Status: Abnormal   Collection Time: 05/02/21  8:35 AM  Result Value Ref Range   Hemoglobin A1C 7.2 (A) 4.0 - 5.6 %   HbA1c POC (<> result, manual entry)     HbA1c, POC (prediabetic range)     HbA1c, POC (controlled diabetic range)      No results found.     All questions at time of visit were answered - patient instructed to contact office with any additional concerns or updates. ER/RTC precautions were reviewed with the patient as applicable.   Please note: manual typing as well as voice recognition software may have been used to produce this document - typos may escape review. Please contact Dr. Sheppard Coil for any needed clarifications.

## 2021-05-03 ENCOUNTER — Other Ambulatory Visit (HOSPITAL_COMMUNITY): Payer: Self-pay

## 2021-05-03 LAB — CBC
HCT: 50.6 % — ABNORMAL HIGH (ref 38.5–50.0)
Hemoglobin: 16.7 g/dL (ref 13.2–17.1)
MCH: 30.3 pg (ref 27.0–33.0)
MCHC: 33 g/dL (ref 32.0–36.0)
MCV: 91.8 fL (ref 80.0–100.0)
MPV: 10.5 fL (ref 7.5–12.5)
Platelets: 296 10*3/uL (ref 140–400)
RBC: 5.51 10*6/uL (ref 4.20–5.80)
RDW: 11.8 % (ref 11.0–15.0)
WBC: 6.3 10*3/uL (ref 3.8–10.8)

## 2021-05-03 LAB — COMPLETE METABOLIC PANEL WITH GFR
AG Ratio: 1.8 (calc) (ref 1.0–2.5)
ALT: 31 U/L (ref 9–46)
AST: 21 U/L (ref 10–35)
Albumin: 4.8 g/dL (ref 3.6–5.1)
Alkaline phosphatase (APISO): 78 U/L (ref 35–144)
BUN: 16 mg/dL (ref 7–25)
CO2: 30 mmol/L (ref 20–32)
Calcium: 10.3 mg/dL (ref 8.6–10.3)
Chloride: 100 mmol/L (ref 98–110)
Creat: 0.89 mg/dL (ref 0.70–1.30)
Globulin: 2.7 g/dL (calc) (ref 1.9–3.7)
Glucose, Bld: 148 mg/dL — ABNORMAL HIGH (ref 65–99)
Potassium: 4.6 mmol/L (ref 3.5–5.3)
Sodium: 140 mmol/L (ref 135–146)
Total Bilirubin: 0.8 mg/dL (ref 0.2–1.2)
Total Protein: 7.5 g/dL (ref 6.1–8.1)
eGFR: 101 mL/min/{1.73_m2} (ref >=60)

## 2021-05-03 LAB — LIPID PANEL
Cholesterol: 195 mg/dL (ref <200)
HDL: 36 mg/dL — ABNORMAL LOW (ref >=40)
LDL Cholesterol (Calc): 119 mg/dL (calc) — ABNORMAL HIGH
Non-HDL Cholesterol (Calc): 159 mg/dL (calc) — ABNORMAL HIGH (ref <130)
Total CHOL/HDL Ratio: 5.4 (calc) — ABNORMAL HIGH (ref <5.0)
Triglycerides: 281 mg/dL — ABNORMAL HIGH (ref <150)

## 2021-05-03 LAB — MICROALBUMIN / CREATININE URINE RATIO
Creatinine, Urine: 60 mg/dL (ref 20–320)
Microalb Creat Ratio: 1378 mcg/mg creat — ABNORMAL HIGH (ref <30)
Microalb, Ur: 82.7 mg/dL

## 2021-05-03 LAB — PSA, TOTAL WITH REFLEX TO PSA, FREE: PSA, Total: 0.3 ng/mL (ref <=4.0)

## 2021-05-04 ENCOUNTER — Other Ambulatory Visit (INDEPENDENT_AMBULATORY_CARE_PROVIDER_SITE_OTHER): Payer: No Typology Code available for payment source | Admitting: Osteopathic Medicine

## 2021-05-04 DIAGNOSIS — Z125 Encounter for screening for malignant neoplasm of prostate: Secondary | ICD-10-CM

## 2021-05-04 DIAGNOSIS — Z0000 Encounter for general adult medical examination without abnormal findings: Secondary | ICD-10-CM

## 2021-05-04 DIAGNOSIS — E1169 Type 2 diabetes mellitus with other specified complication: Secondary | ICD-10-CM | POA: Diagnosis not present

## 2021-05-04 DIAGNOSIS — E785 Hyperlipidemia, unspecified: Secondary | ICD-10-CM

## 2021-05-04 DIAGNOSIS — I10 Essential (primary) hypertension: Secondary | ICD-10-CM

## 2021-05-04 DIAGNOSIS — E1165 Type 2 diabetes mellitus with hyperglycemia: Secondary | ICD-10-CM

## 2021-05-04 MED ORDER — SIMVASTATIN 40 MG PO TABS: 80.0000 mg | ORAL_TABLET | Freq: Every day | ORAL | 1 refills | Status: DC

## 2021-05-04 NOTE — Progress Notes (Signed)
Johnathan Warner is a 55 y.o. male who presents to  [facility]  today, 05/02/21 seeking care for the following:  Annual physical  Diabetes check up     [place] with other pertinent findings:  The primary encounter diagnosis was Annual physical exam. Diagnoses of Type 2 diabetes mellitus with hyperglycemia, without long-term current use of insulin ([place]), Hyperlipidemia associated with type 2 diabetes mellitus ([place]), Prostate cancer screening, and Essential hypertension were also pertinent to this visit.   1. Annual physical exam General Preventive Care Most recent routine screening labs: see below.  Blood pressure goal 130/80 or less.  Tobacco: don't! Please let me know if you need help quitting!ible moderation is ok for most adults - if you have concerns about your alcohol intake, please talk to me!  Exercise: as tolerated to reduce risk of cardiovascular disease and diabetes. Strength training will also prevent osteoporosis.  Mental health: if need for mental health care (medicines, counseling, other), or concerns about moods, please let me know!  Sexual / Reproductive health: if need for STD testing, or if concerns with libido/pain problems, please let me know! If you need to discuss family planning, please let me know!  Advanced Directive: Living Will and/or Healthcare Power of Attorney recommended for all adults, regardless of age or health.  Vaccines Flu vaccine: for almost everyone, every fall.  Shingles vaccine: after age 52.  Pneumonia vaccines: after age 57, or sooner if certain medical conditions. Tetanus booster: every 10 years / 3rd trimester of pregnancy HPV vaccine: Gardasil up to age 17 to prevent HPV-associated diseases, including certain cancers.  COVID vaccine: THANKS for getting your vaccine! :) / STRONGLY RECOMMENDED  Immunization History  Administered Date(s) Administered   Influenza,inj,Quad PF,6+  Mos 05/18/2019, 06/24/2020, 05/02/2021   Zoster Recombinat (Shingrix) 02/26/2019, 05/18/2019  Cancer screenings  Colon cancer screening: for everyone age 22-75. Colonoscopy available for all, many people also qualify for the Cologuard stool test - need records  Prostate cancer screening: PSA blood test age 24-71 Lung cancer screening: CT chest every year for those aged 71 to 42 years who have a 20 pack-year smoking history and currently smoke or have quit within the past 15 years  Infection screenings  HIV: recommended screening at least once age 67-65, more often as needed. Gonorrhea/Chlamydia: screening as needed Hepatitis C: recommended once for everyone age 80-03 TB: certain at-risk populations, or depending on work requirements and/or travel history Other Bone Density Test: recommended for women at age 7, men at age 41, sooner depending on risk factors Abdominal Aortic Aneurysm: screening with ultrasound recommended once for men age 46-75 who have ever smoked        2. Type 2 diabetes mellitus with hyperglycemia, without long-term current use of insulin (HCC) A1c above goal at 7.2, about at his baseline.  Discussed importance of diet/exercise and adherence to medications  3. Hyperlipidemia associated with type 2 diabetes mellitus ([place]) See below, LDL above goal, increasing simvastatin  4. Prostate cancer screening PSA within normal limits  5. Essential hypertension BP Readings from Last 3 Encounters:  05/02/21 127/81  08/22/20 133/90  04/09/20 111/76  BP at goal    There are no Patient Instructions on file for this visit.  No orders of the defined types were placed in this encounter.   Meds ordered this encounter  Medications   simvastatin (ZOCOR) 40 MG tablet    Sig: Take 2 tablets (80 mg total)  by mouth daily. Increased based on labs 05/2021    Dispense:  90 tablet    Refill:  1     See below for relevant physical exam findings  See below for recent lab and  imaging results reviewed  Medications, allergies, PMH, PSH, SocH, FamH reviewed below    Follow-up instructions: No follow-ups on file.                                        Exam:  There were no vitals taken for this visit. Constitutional: VS see above. General Appearance: alert, well-developed, well-nourished, NAD Neck: No masses, trachea midline.  Respiratory: Normal respiratory effort. no wheeze, no rhonchi, no rales Cardiovascular: S1/S2 normal, no murmur, no rub/gallop auscultated. RRR.  Musculoskeletal: Gait normal. Symmetric and independent movement of all extremities Abdominal: non-tender, non-distended, no appreciable organomegaly, neg Murphy's, BS WNLx4 Neurological: Normal balance/coordination. No tremor. Skin: warm, dry, intact.  Psychiatric: Normal judgment/insight. Normal mood and affect. Oriented x3.   No outpatient medications have been marked as taking for the 05/04/21 encounter (Orders Only) with Emeterio Reeve, DO.    Allergies  Allergen Reactions   Sulfa Antibiotics Other (See Comments)    Hot flashes photodermatitis    Amlodipine Other (See Comments)    As per pt - rash on face, hot flashes and photodermatitis.    Atorvastatin Other (See Comments)   Celecoxib Other (See Comments)    photodermatitis   Hydrochlorothiazide Other (See Comments)    photodermatitis   Lisinopril Other (See Comments)    Cough   Simvastatin Other (See Comments)    Inc LFT's   Sulfamethoxazole     photodermatitis   Sulfonamide Derivatives    Tetanus Toxoid, Adsorbed Other (See Comments)    unknown     Patient Active Problem List   Diagnosis Date Noted   Otitis externa 06/05/2018   History of dizziness 05/28/2018   Tachycardia with heart rate 100-120 beats per minute 05/28/2018   Type 2 diabetes mellitus with hyperglycemia, without long-term current use of insulin ([place]) 10/23/2016   Essential hypertension 10/23/2016    Hyperlipidemia associated with type 2 diabetes mellitus ([place]) 10/23/2016    Family History  Problem Relation Age of Onset   Cancer Mother    Stroke Father    Hypertension Father     Social History   Tobacco Use  Smoking Status Former   Types: Cigarettes   Quit date: 1994   Years since quitting: 28.6  Smokeless Tobacco Never    No past surgical history on file.  Immunization History  Administered Date(s) Administered   Influenza,inj,Quad PF,6+ Mos 05/18/2019, 06/24/2020, 05/02/2021   Zoster Recombinat (Shingrix) 02/26/2019, 05/18/2019    Recent Results (from the past 2160 hour(s))  Microalbumin / creatinine urine ratio     Status: Abnormal   Collection Time: 05/02/21 12:00 AM  Result Value Ref Range   Creatinine, Urine 60 20 - 320 mg/dL   Microalb, Ur 82.7 mg/dL    Comment: Verified by repeat analysis. Marland Kitchen Reference Range Not established    Microalb Creat Ratio 1,378 (H) <30 mcg/mg creat    Comment: . The ADA defines abnormalities in albumin excretion as follows: Marland Kitchen Albuminuria Category        Result (mcg/mg creatinine) . Normal to Mildly increased   <30 Moderately increased         30-299  Severely increased           >  OR = 300 . The ADA recommends that at least two of three specimens collected within a 3-6 month period be abnormal before considering a patient to be within a diagnostic category.   CBC     Status: Abnormal   Collection Time: 05/02/21 12:00 AM  Result Value Ref Range   WBC 6.3 3.8 - 10.8 Thousand/uL   RBC 5.51 4.20 - 5.80 Million/uL   Hemoglobin 16.7 13.2 - 17.1 g/dL   HCT 50.6 (H) 38.5 - 50.0 %   MCV 91.8 80.0 - 100.0 fL   MCH 30.3 27.0 - 33.0 pg   MCHC 33.0 32.0 - 36.0 g/dL   RDW 11.8 11.0 - 15.0 %   Platelets 296 140 - 400 Thousand/uL   MPV 10.5 7.5 - 12.5 fL  COMPLETE METABOLIC PANEL WITH GFR     Status: Abnormal   Collection Time: 05/02/21 12:00 AM  Result Value Ref Range   Glucose, Bld 148 (H) 65 - 99 mg/dL    Comment: .             Fasting reference interval . For someone without known diabetes, a glucose value >125 mg/dL indicates that they may have diabetes and this should be confirmed with a follow-up test. .    BUN 16 7 - 25 mg/dL   Creat 0.89 0.70 - 1.30 mg/dL   eGFR 101 > OR = 60 mL/min/1.71m    Comment: The eGFR is based on the CKD-EPI 2021 equation. To calculate  the new eGFR from a previous Creatinine or Cystatin C result, go to https://www.kidney.org/professionals/ kdoqi/gfr%5Fcalculator    BUN/Creatinine Ratio NOT APPLICABLE 6 - 22 (calc)   Sodium 140 135 - 146 mmol/L   Potassium 4.6 3.5 - 5.3 mmol/L   Chloride 100 98 - 110 mmol/L   CO2 30 20 - 32 mmol/L   Calcium 10.3 8.6 - 10.3 mg/dL   Total Protein 7.5 6.1 - 8.1 g/dL   Albumin 4.8 3.6 - 5.1 g/dL   Globulin 2.7 1.9 - 3.7 g/dL (calc)   AG Ratio 1.8 1.0 - 2.5 (calc)   Total Bilirubin 0.8 0.2 - 1.2 mg/dL   Alkaline phosphatase (APISO) 78 35 - 144 U/L   AST 21 10 - 35 U/L   ALT 31 9 - 46 U/L  Lipid panel     Status: Abnormal   Collection Time: 05/02/21 12:00 AM  Result Value Ref Range   Cholesterol 195 <200 mg/dL   HDL 36 (L) > OR = 40 mg/dL   Triglycerides 281 (H) <150 mg/dL    Comment: . If a non-fasting specimen was collected, consider repeat triglyceride testing on a fasting specimen if clinically indicated.  JYates Decampet al. J. of Clin. Lipidol. 28022;3:361-224 .Marland Kitchen   LDL Cholesterol (Calc) 119 (H) mg/dL (calc)    Comment: Reference range: <100 . Desirable range <100 mg/dL for primary prevention;   <70 mg/dL for patients with CHD or diabetic patients  with > or = 2 CHD risk factors. .Marland KitchenLDL-C is now calculated using the Martin-Hopkins  calculation, which is a validated novel method providing  better accuracy than the Friedewald equation in the  estimation of LDL-C.  MCresenciano Genreet al. JAnnamaria Helling 24975;300(51: 2061-2068  (http://education.Quest[employer].com/faq/FAQ164)    Total CHOL/HDL Ratio 5.4 (H) <5.0 (calc)   Non-HDL  Cholesterol (Calc) 159 (H) <130 mg/dL (calc)    Comment: For patients with diabetes plus 1 major ASCVD risk  factor, treating to a non-HDL-C goal of <100 mg/dL  (LDL-C  of <70 mg/dL) is considered a therapeutic  option.   PSA, Total with Reflex to PSA, Free     Status: None   Collection Time: 05/02/21 12:00 AM  Result Value Ref Range   PSA, Total 0.3 < OR = 4.0 ng/mL    Comment: The Total PSA value from this assay system is  standardized against the equimolar PSA standard.  The test result will be approximately 20% higher  when compared to the [facility] Total PSA  (Siemens assay). Comparison of serial PSA results  should be interpreted with this fact in mind. Marland Kitchen PSA was performed using the Beckman Coulter  Immunoassay method. Values obtained from different  assay methods cannot be used interchangeably. PSA  levels, regardless of value, should not be  interpreted as absolute evidence of the presence or  absence of disease.   POCT HgB A1C     Status: Abnormal   Collection Time: 05/02/21  8:35 AM  Result Value Ref Range   Hemoglobin A1C 7.2 (A) 4.0 - 5.6 %   HbA1c POC (<> result, manual entry)     HbA1c, POC (prediabetic range)     HbA1c, POC (controlled diabetic range)      No results found.     All questions at time of visit were answered - patient instructed to contact office with any additional concerns or updates. ER/RTC precautions were reviewed with the patient as applicable.   Please note: manual typing as well as voice recognition software may have been used to produce this document - typos may escape review. Please contact Dr. Sheppard Coil for any needed clarifications.

## 2021-05-08 ENCOUNTER — Other Ambulatory Visit (HOSPITAL_COMMUNITY): Payer: Self-pay

## 2021-06-01 ENCOUNTER — Other Ambulatory Visit (HOSPITAL_COMMUNITY): Payer: Self-pay

## 2021-06-01 MED ORDER — CARESTART COVID-19 HOME TEST VI KIT
PACK | 0 refills | Status: DC
  Filled 2021-06-01: qty 2, 2d supply, fill #0

## 2021-06-12 NOTE — Progress Notes (Deleted)
Referring-[name], DO Reason for referral-chest pain  HPI: 55 year old male for evaluation of chest pain at request of [name], DO.  Laboratories August 2022 showed creatinine 0.89 and hemoglobin 16.7; LDL 119.  Current Outpatient Medications  Medication Sig Dispense Refill   acetic acid-hydrocortisone (VOSOL-HC) OTIC solution PLACE 3 DROPS INTO BOTH EARS 3 (THREE) TIMES DAILY FOR ONE WEEK THEN ONCE AS NEEDED AFTER SWIMMING 10 mL 0   aspirin EC 81 MG tablet Take 1 tablet (81 mg total) by mouth daily. 90 tablet 3   betamethasone dipropionate 0.05 % cream Apply topically 2 (two) times daily to affected eczema area(s) as needed 45 g 1   Blood Glucose Monitoring Suppl (FREESTYLE LITE) w/Device KIT Use as directed 1 kit 0   Continuous Blood Gluc Receiver (FREESTYLE LIBRE 14 DAY READER) DEVI Use as directed 1 each 99   Continuous Blood Gluc Sensor (FREESTYLE LIBRE 14 DAY SENSOR) MISC Use as directed 6 each 99   COVID-19 At Home Antigen Test (CARESTART COVID-19 HOME TEST) KIT Use as directed 4 each 0   empagliflozin (JARDIANCE) 25 MG TABS tablet Take 1 tablet (25 mg total) by mouth daily. 90 tablet 1   glucose blood test strip Use up to 4 times daily to test blood sugar. 100 strip 99   influenza vac split quadrivalent PF (FLUARIX) 0.5 ML injection USE AS DIRECTED .5 mL 0   Lancets (FREESTYLE) lancets Check glucose up to 4 times daily. 100 each PRN   losartan (COZAAR) 50 MG tablet Take 1 tablet (50 mg total) by mouth daily. 90 tablet 1   metFORMIN (GLUCOPHAGE-XR) 500 MG 24 hr tablet Take 2 tablets (1,000 mg total) by mouth 2 (two) times daily. 360 tablet 1   sildenafil (VIAGRA) 100 MG tablet Take 0.5-1 tablets (50-100 mg total) by mouth as needed for erectile dysfunction. 10 tablet 11   simvastatin (ZOCOR) 40 MG tablet Take 2 tablets (80 mg total) by mouth daily. Increased based on labs 05/2021 90 tablet 1   sitaGLIPtin (JANUVIA) 100 MG tablet Take 1 tablet (100 mg total) by  mouth daily. 90 tablet 1   triamcinolone cream (KENALOG) 0.1 % Apply 1 application topically 2 (two) times daily. 30 g 0   No current facility-administered medications for this visit.    Allergies  Allergen Reactions   Sulfa Antibiotics Other (See Comments)    Hot flashes photodermatitis    Amlodipine Other (See Comments)    As per pt - rash on face, hot flashes and photodermatitis.    Atorvastatin Other (See Comments)   Celecoxib Other (See Comments)    photodermatitis   Hydrochlorothiazide Other (See Comments)    photodermatitis   Lisinopril Other (See Comments)    Cough   Simvastatin Other (See Comments)    Inc LFT's   Sulfamethoxazole     photodermatitis   Sulfonamide Derivatives    Tetanus Toxoid, Adsorbed Other (See Comments)    unknown      Past Medical History:  Diagnosis Date   Diabetes mellitus without complication (HCC)    Hyperlipemia    Hypertension     No past surgical history on file.  Social History   Socioeconomic History   Marital status: Married    Spouse name: Not on file   Number of children: Not on file   Years of education: Not on file   Highest education level: Not on file  Occupational History   Not on file  Tobacco Use  Smoking status: Former    Types: Cigarettes    Quit date: 1994    Years since quitting: 28.7   Smokeless tobacco: Never  Vaping Use   Vaping Use: Never used  Substance and Sexual Activity   Alcohol use: No   Drug use: Not on file   Sexual activity: Yes  Other Topics Concern   Not on file  Social History Narrative   Not on file   Social Determinants of Health   Financial Resource Strain: Not on file  Food Insecurity: Not on file  Transportation Needs: Not on file  Physical Activity: Not on file  Stress: Not on file  Social Connections: Not on file  Intimate Partner Violence: Not on file    Family History  Problem Relation Age of Onset   Cancer Mother    Stroke Father    Hypertension Father      ROS: no fevers or chills, productive cough, hemoptysis, dysphasia, odynophagia, melena, hematochezia, dysuria, hematuria, rash, seizure activity, orthopnea, PND, pedal edema, claudication. Remaining systems are negative.  Physical Exam:   There were no vitals taken for this visit.  General:  Well developed/well nourished in NAD Skin warm/dry Patient not depressed No peripheral clubbing Back-normal HEENT-normal/normal eyelids Neck supple/normal carotid upstroke bilaterally; no bruits; no JVD; no thyromegaly chest - CTA/ normal expansion CV - RRR/normal S1 and S2; no murmurs, rubs or gallops;  PMI nondisplaced Abdomen -NT/ND, no HSM, no mass, + bowel sounds, no bruit 2+ femoral pulses, no bruits Ext-no edema, chords, 2+ DP Neuro-grossly nonfocal  ECG - personally reviewed  A/P  1 chest pain-  2 hypertension-  3 hyperlipidemia-  Kirk Ruths, MD

## 2021-06-18 ENCOUNTER — Ambulatory Visit: Payer: No Typology Code available for payment source | Admitting: Cardiology

## 2021-07-03 ENCOUNTER — Telehealth (INDEPENDENT_AMBULATORY_CARE_PROVIDER_SITE_OTHER): Payer: No Typology Code available for payment source | Admitting: Medical-Surgical

## 2021-07-03 ENCOUNTER — Encounter: Payer: Self-pay | Admitting: Medical-Surgical

## 2021-07-03 ENCOUNTER — Other Ambulatory Visit (HOSPITAL_COMMUNITY): Payer: Self-pay

## 2021-07-03 DIAGNOSIS — J069 Acute upper respiratory infection, unspecified: Secondary | ICD-10-CM

## 2021-07-03 DIAGNOSIS — H109 Unspecified conjunctivitis: Secondary | ICD-10-CM | POA: Diagnosis not present

## 2021-07-03 DIAGNOSIS — B9689 Other specified bacterial agents as the cause of diseases classified elsewhere: Secondary | ICD-10-CM | POA: Diagnosis not present

## 2021-07-03 MED ORDER — CARESTART COVID-19 HOME TEST VI KIT
PACK | 0 refills | Status: DC
  Filled 2021-07-03: qty 2, 2d supply, fill #0

## 2021-07-03 MED ORDER — POLYMYXIN B-TRIMETHOPRIM 10000-0.1 UNIT/ML-% OP SOLN
1.0000 [drp] | Freq: Four times a day (QID) | OPHTHALMIC | 0 refills | Status: DC
  Filled 2021-07-03: qty 10, 7d supply, fill #0

## 2021-07-03 NOTE — Progress Notes (Signed)
Virtual Visit via Video Note  I connected with Johnathan Warner on 07/03/21 at  1:00 PM EDT by a video enabled telemedicine application and verified that I am speaking with the correct person using two identifiers.   I discussed the limitations of evaluation and management by telemedicine and the availability of in person appointments. The patient expressed understanding and agreed to proceed.  Patient location: home Provider locations: office  Subjective:    CC: upper respiratory s/s, eye problem   HPI: Pleasant 55 year old male presenting via MyChart video visit with reports of eye concerns and upper respiratory symptoms.  Notes that his symptoms started 4 to 5 days ago with bilateral eye redness, pain, and morning discharge that is described as yellow and sticky.  Has had some lightheadedness, rhinorrhea, mild chest congestion, sore throat, and generalized malaise.  Has been taking Tylenol every 6 hours which is somewhat helpful.  Also using warm compresses on his eyes to help remove the discharge.  Notes his wife did pick up a cold medication but he has not taken that today.  Most concerned about his eye symptoms and feels like the upper respiratory symptoms are manageable at this point.  Has questions about when he should be cleared to go back to work.  Past medical history, Surgical history, Family history not pertinant except as noted below, Social history, Allergies, and medications have been entered into the medical record, reviewed, and corrections made.   Review of Systems: See HPI for pertinent positives and negatives.   Objective:    General: Speaking clearly in complete sentences without any shortness of breath.  Alert and oriented x3.  Normal judgment. No apparent acute distress.  Impression and Recommendations:    1. Bacterial conjunctivitis of both eyes Polytrim 2 drops each eye 4 times daily for 7 days.  Reviewed handwashing and infection prevention measures.  After 48 hours  on eyedrops, cleared to go back to work.  2. Viral URI Symptoms are consistent with a viral upper respiratory infection and continue to be mild.  Reviewed options for over-the-counter treatment in patients with diabetes and hypertension.  Upper respiratory shopping list sent to patient via MyChart.  Advised patient to call me should his symptoms improve then worsen or fail to improve at all by Thursday or Friday and I will be glad to send something in at that point.  Until then, symptomatic treatment only.  I discussed the assessment and treatment plan with the patient. The patient was provided an opportunity to ask questions and all were answered. The patient agreed with the plan and demonstrated an understanding of the instructions.   The patient was advised to call back or seek an in-person evaluation if the symptoms worsen or if the condition fails to improve as anticipated.  20 minutes of non-face-to-face time was provided during this encounter.  Return if symptoms worsen or fail to improve.  Thayer Ohm, DNP, APRN, FNP-BC [facility]

## 2021-07-03 NOTE — Progress Notes (Signed)
Started 2 or 3 days ago Runny nose, congestion, watery eyes, red eyes, eyes hurt, no fever Taking tylenol Covid negative x3 Needs work note

## 2021-07-03 NOTE — Patient Instructions (Signed)
Medications & Home Remedies for Upper Respiratory Illness   Note: the following list assumes no pregnancy, normal liver & kidney function and no other drug interactions. I have included medications which are safe for you to use barring any interactions with current medications, but these may not be appropriate for everyone. Always ask a pharmacist or qualified medical provider if you have any questions!    Aches/Pains, Fever, Headache OTC Acetaminophen (Tylenol) 500 mg tablets - take max 2 tablets (1000 mg) every 6 hours (4 times per day)  OTC Ibuprofen (Motrin) 200 mg tablets - take max 4 tablets (800 mg) every 6 hours*   Sinus Congestion Prescription Atrovent as directed OTC Nasal Saline if desired to rinse OTC Oxymetolazone (Afrin, others) sparing use due to rebound congestion, NEVER use in kids OTC Diphenhydramine (Benadryl) 25 mg tablets - take max 2 tablets every 4 hours   Cough & Sore Throat Prescription cough pills or syrups as directed OTC Dextromethorphan (Robitussin, others) - cough suppressant, caution in diabetes OTC Guaifenesin (Robitussin, Mucinex, others) - expectorant (helps cough up mucus) (Dextromethorphan and Guaifenesin also come in a combination tablet/syrup) OTC Lozenges w/ Benzocaine + Menthol (Cepacol) Honey - as much as you want! Teas which "coat the throat" - look for ingredients Elm Bark, Licorice Root, Marshmallow Root   Other OTC Zinc Lozenges within 24 hours of symptoms onset - mixed evidence this shortens the duration of the common cold Don't waste your money on Vitamin C or Echinacea in acute illness - it's already too late!    *Caution in patients with high blood pressure

## 2021-07-07 ENCOUNTER — Other Ambulatory Visit (HOSPITAL_COMMUNITY): Payer: Self-pay

## 2021-07-07 ENCOUNTER — Other Ambulatory Visit: Payer: Self-pay

## 2021-07-07 ENCOUNTER — Emergency Department (INDEPENDENT_AMBULATORY_CARE_PROVIDER_SITE_OTHER)
Admission: EM | Admit: 2021-07-07 | Discharge: 2021-07-07 | Disposition: A | Discharge status: Home / Self Care | Payer: 59 | Source: Home / Self Care

## 2021-07-07 DIAGNOSIS — J309 Allergic rhinitis, unspecified: Secondary | ICD-10-CM | POA: Diagnosis not present

## 2021-07-07 DIAGNOSIS — J029 Acute pharyngitis, unspecified: Secondary | ICD-10-CM | POA: Diagnosis not present

## 2021-07-07 DIAGNOSIS — R059 Cough, unspecified: Secondary | ICD-10-CM | POA: Diagnosis not present

## 2021-07-07 LAB — POCT RAPID STREP A (OFFICE): Rapid Strep A Screen: NEGATIVE

## 2021-07-07 MED ORDER — FEXOFENADINE HCL 180 MG PO TABS
180.0000 mg | ORAL_TABLET | Freq: Every day | ORAL | 0 refills | Status: AC
  Filled 2021-07-07: qty 30, 30d supply, fill #0

## 2021-07-07 MED ORDER — BENZONATATE 200 MG PO CAPS
200.0000 mg | ORAL_CAPSULE | Freq: Three times a day (TID) | ORAL | 0 refills | Status: AC | PRN
  Filled 2021-07-07: qty 30, 10d supply, fill #0

## 2021-07-07 NOTE — Discharge Instructions (Addendum)
Advised patient to take medications as directed with food.  Advised patient to take Allegra daily for the next 5 to 7 days for concurrent postnasal drainage/drip then as needed afterwards.  Advised patient may use Tessalon Perles daily or as needed for cough.  Advised patient we will follow-up with COVID-19 flu A/B results once received.

## 2021-07-07 NOTE — ED Provider Notes (Signed)
[name]    CSN: [id number] Arrival date & time: 07/07/21  1238      History   Chief Complaint Chief Complaint  Patient presents with  . Cough  . Sore Throat    HPI Johnathan Warner is a 55 y.o. male.   HPI 55 year old male presents with sore throat, cough and nasal congestion for 2 to 3 days.  Reports negative home COVID-19 test.  Past Medical History:  Diagnosis Date  . Diabetes mellitus without complication ([place])   . Hyperlipemia   . Hypertension     Patient Active Problem List   Diagnosis Date Noted  . Otitis externa 06/05/2018  . History of dizziness 05/28/2018  . Tachycardia with heart rate 100-120 beats per minute 05/28/2018  . Type 2 diabetes mellitus with hyperglycemia, without long-term current use of insulin ([place]) 10/23/2016  . Essential hypertension 10/23/2016  . Hyperlipidemia associated with type 2 diabetes mellitus ([place]) 10/23/2016    History reviewed. No pertinent surgical history.     Home Medications    Prior to Admission medications   Medication Sig Start Date End Date Taking? Authorizing Provider  acetaminophen (TYLENOL) 500 MG tablet Take 500 mg by mouth every 6 (six) hours as needed.   Yes [provider]  benzonatate (TESSALON) 200 MG capsule Take 1 capsule (200 mg total) by mouth 3 (three) times daily as needed for up to 7 days for cough. 07/07/21 07/17/21 Yes Eliezer Lofts, FNP  fexofenadine [facility] ALLERGY) 180 MG tablet Take 1 tablet (180 mg total) by mouth daily. 07/07/21 08/06/21 Yes Eliezer Lofts, FNP  guaiFENesin (ROBITUSSIN) 100 MG/5ML liquid Take 5 mLs by mouth every 4 (four) hours as needed for cough or to loosen phlegm.   Yes [provider]  acetic acid-hydrocortisone (VOSOL-HC) OTIC solution PLACE 3 DROPS INTO BOTH EARS 3 (THREE) TIMES DAILY FOR ONE WEEK THEN ONCE AS NEEDED AFTER SWIMMING 08/22/20 08/22/21  Emeterio Reeve, DO  aspirin EC 81 MG tablet Take 1 tablet (81 mg total) by mouth daily.  02/26/19   Emeterio Reeve, DO  betamethasone dipropionate 0.05 % cream Apply topically 2 (two) times daily to affected eczema area(s) as needed 05/02/21   Emeterio Reeve, DO  Blood Glucose Monitoring Suppl (FREESTYLE LITE) w/Device KIT Use as directed 03/14/21   Emeterio Reeve, DO  Continuous Blood Gluc Receiver (FREESTYLE LIBRE 14 DAY READER) DEVI Use as directed 05/02/21   Emeterio Reeve, DO  Continuous Blood Gluc Sensor (FREESTYLE LIBRE 14 DAY SENSOR) MISC Use as directed 05/02/21   Emeterio Reeve, DO  COVID-19 At [facility] Antigen Test [facility] COVID-19 HOME TEST) KIT Use as directed 06/01/21   Jefm Bryant, [facility]  COVID-19 At Home Antigen Test [facility] COVID-19 HOME TEST) KIT Use as directed 07/03/21   Jefm Bryant, RPH  empagliflozin (JARDIANCE) 25 MG TABS tablet Take 1 tablet (25 mg total) by mouth daily. 05/02/21   Emeterio Reeve, DO  glucose blood test strip Use up to 4 times daily to test blood sugar. 05/02/21   Emeterio Reeve, DO  Lancets (FREESTYLE) lancets Check glucose up to 4 times daily. 05/02/21   Emeterio Reeve, DO  losartan (COZAAR) 50 MG tablet Take 1 tablet (50 mg total) by mouth daily. 05/02/21   Emeterio Reeve, DO  metFORMIN (GLUCOPHAGE-XR) 500 MG 24 hr tablet Take 2 tablets (1,000 mg total) by mouth 2 (two) times daily. 05/02/21   Emeterio Reeve, DO  sildenafil (VIAGRA) 100 MG tablet Take 0.5-1 tablets (50-100 mg total) by mouth  as needed for erectile dysfunction. 03/31/20 03/31/21  Emeterio Reeve, DO  simvastatin (ZOCOR) 40 MG tablet Take 2 tablets (80 mg total) by mouth daily. Increased based on labs 05/2021 05/04/21   Emeterio Reeve, DO  sitaGLIPtin (JANUVIA) 100 MG tablet Take 1 tablet (100 mg total) by mouth daily. 05/02/21   Emeterio Reeve, DO  triamcinolone cream (KENALOG) 0.1 % Apply 1 application topically 2 (two) times daily. 04/09/20   Noe Gens, PA-C  trimethoprim-polymyxin b (POLYTRIM) ophthalmic solution Place 1 drop into  both eyes 4 (four) times daily for 7 days 07/03/21   Samuel Bouche, NP    Family History Family History  Problem Relation Age of Onset  . Cancer Mother   . Stroke Father   . Hypertension Father     Social History Social History   Tobacco Use  . Smoking status: Former    Types: Cigarettes    Quit date: 1994    Years since quitting: 28.8  . Smokeless tobacco: Never  Vaping Use  . Vaping Use: Never used  Substance Use Topics  . Alcohol use: No  . Drug use: Not Currently     Allergies   Sulfa antibiotics; Amlodipine; Atorvastatin; Celecoxib; Hydrochlorothiazide; Lisinopril; Simvastatin; Sulfamethoxazole; Sulfonamide derivatives; and Tetanus toxoid, adsorbed   Review of Systems Review of Systems  HENT:  Positive for congestion and sore throat.   Respiratory:  Positive for cough.   All other systems reviewed and are negative.   Physical Exam Triage Vital Signs ED Triage Vitals  Enc Vitals Group     BP 07/07/21 1326 136/82     Pulse Rate 07/07/21 1326 (!) 101     Resp 07/07/21 1326 20     Temp 07/07/21 1326 97.8 F (36.6 C)     Temp Source 07/07/21 1326 Oral     SpO2 07/07/21 1326 95 %     Weight 07/07/21 1319 167 lb (75.8 kg)     Height 07/07/21 1319 $RemoveBefor'5\' 6"'rxLNOqIjhKhY$  (1.676 m)     Head Circumference --      Peak Flow --      Pain Score 07/07/21 1318 8     Pain Loc --      Pain Edu? --      Excl. in [place]? --    No data found.  Updated Vital Signs BP 136/82 (BP Location: Left Arm)   Pulse (!) 101   Temp 97.8 F (36.6 C) (Oral)   Resp 20   Ht $R'5\' 6"'DF$  (1.676 m)   Wt 167 lb (75.8 kg)   SpO2 95%   BMI 26.95 kg/m    Physical Exam Vitals and nursing note reviewed.  Constitutional:      General: He is not in acute distress.    Appearance: He is well-developed and normal weight. He is not ill-appearing.  HENT:     Head: Normocephalic and atraumatic.     Right Ear: Tympanic membrane and ear canal normal.     Left Ear: Tympanic membrane and ear canal normal.      Mouth/Throat:     Mouth: Mucous membranes are moist.     Pharynx: Oropharynx is clear. Uvula midline.     Comments: Moderate to significant amount of clear drainage of posterior oropharynx noted Eyes:     Conjunctiva/sclera: Conjunctivae normal.     Pupils: Pupils are equal, round, and reactive to light.  Cardiovascular:     Rate and Rhythm: Normal rate and regular rhythm.  Pulmonary:  Effort: Pulmonary effort is normal.     Breath sounds: Normal breath sounds.  Musculoskeletal:     Cervical back: Normal range of motion and neck supple.  Skin:    General: Skin is warm and dry.  Neurological:     General: No focal deficit present.     Mental Status: He is alert and oriented to person, place, and time.     UC Treatments / Results  Labs (all labs ordered are listed, but only abnormal results are displayed) Labs Reviewed  COVID-19, FLU A+B NAA  POCT RAPID STREP A (OFFICE)    EKG   Radiology No results found.  Procedures Procedures (including critical care time)  Medications Ordered in UC Medications - No data to display  Initial Impression / Assessment and Plan / UC Course  I have reviewed the triage vital signs and the nursing notes.  Pertinent labs & imaging results that were available during my care of the patient were reviewed by me and considered in my medical decision making (see chart for details).     MDM: 1.  Sore throat-rapid strep negative; 2.  Cough-COVID-19 flu A/B ordered, Rx Tessalon Perles; 3.  Allergic rhinitis-Rx'd Allegra. Advised patient to take medications as directed with food.  Advised patient to take Allegra daily for the next 5 to 7 days for concurrent postnasal drainage/drip then as needed afterwards.  Advised patient may use Tessalon Perles daily or as needed for cough.  Advised patient we will follow-up with COVID-19 flu A/B results once received.  Patient discharged home, hemodynamically stable. Final Clinical Impressions(s) / UC Diagnoses    Final diagnoses:  Sore throat  Cough, unspecified type  Allergic rhinitis, unspecified seasonality, unspecified trigger     Discharge Instructions      Advised patient to take medications as directed with food.  Advised patient to take Allegra daily for the next 5 to 7 days for concurrent postnasal drainage/drip then as needed afterwards.  Advised patient may use Tessalon Perles daily or as needed for cough.  Advised patient we will follow-up with COVID-19 flu A/B results once received.     ED Prescriptions     Medication Sig Dispense Auth. Provider   fexofenadine (ALLEGRA ALLERGY) 180 MG tablet Take 1 tablet (180 mg total) by mouth daily. 30 tablet Eliezer Lofts, FNP   benzonatate (TESSALON) 200 MG capsule Take 1 capsule (200 mg total) by mouth 3 (three) times daily as needed for up to 7 days for cough. 30 capsule Eliezer Lofts, FNP      PDMP not reviewed this encounter.   Eliezer Lofts, [place] 07/07/21 1436

## 2021-07-07 NOTE — ED Triage Notes (Addendum)
Pt presents to Urgent Care with c/o sore throat, cough and nasal congestion x several days. Tested negative for COVID @ home.

## 2021-07-09 LAB — COVID-19, FLU A+B NAA
Influenza A, NAA: NOT DETECTED
Influenza B, NAA: NOT DETECTED
SARS-CoV-2, NAA: NOT DETECTED

## 2021-07-16 ENCOUNTER — Other Ambulatory Visit (HOSPITAL_COMMUNITY): Payer: Self-pay

## 2021-07-31 ENCOUNTER — Other Ambulatory Visit (HOSPITAL_COMMUNITY): Payer: Self-pay

## 2021-07-31 ENCOUNTER — Other Ambulatory Visit: Payer: Self-pay | Admitting: Osteopathic Medicine

## 2021-07-31 MED ORDER — EMPAGLIFLOZIN 25 MG PO TABS
25.0000 mg | ORAL_TABLET | Freq: Every day | ORAL | 0 refills | Status: DC
  Filled 2021-07-31: qty 90, 90d supply, fill #0

## 2021-07-31 MED ORDER — SIMVASTATIN 40 MG PO TABS
40.0000 mg | ORAL_TABLET | Freq: Every day | ORAL | 0 refills | Status: DC
  Filled 2021-07-31: qty 90, 90d supply, fill #0

## 2021-07-31 NOTE — Telephone Encounter (Signed)
Per allergy list - simvastatin increases LFT's levels. Per notes in allergy panel - patient is still taking the medication. Is it appropriate to refill simvastatin rx? Pls advise, thanks.

## 2021-08-17 ENCOUNTER — Other Ambulatory Visit (HOSPITAL_COMMUNITY): Payer: Self-pay

## 2021-10-30 ENCOUNTER — Telehealth: Payer: Self-pay | Admitting: Medical-Surgical

## 2021-10-30 ENCOUNTER — Other Ambulatory Visit (HOSPITAL_COMMUNITY): Payer: Self-pay

## 2021-10-30 MED ORDER — FREESTYLE LIBRE 14 DAY SENSOR MISC
99 refills | Status: DC
  Filled 2021-10-30: qty 2, 28d supply, fill #0
  Filled 2021-11-29: qty 2, 28d supply, fill #1

## 2021-10-30 MED ORDER — EMPAGLIFLOZIN 25 MG PO TABS
25.0000 mg | ORAL_TABLET | Freq: Every day | ORAL | 0 refills | Status: DC
  Filled 2021-10-30: qty 30, 30d supply, fill #0
  Filled 2021-11-29: qty 30, 30d supply, fill #1
  Filled 2022-01-07: qty 30, 30d supply, fill #2

## 2021-10-30 MED ORDER — METFORMIN HCL ER 500 MG PO TB24
1000.0000 mg | ORAL_TABLET | Freq: Two times a day (BID) | ORAL | 0 refills | Status: DC
  Filled 2021-10-30: qty 120, 30d supply, fill #0
  Filled 2021-11-29: qty 120, 30d supply, fill #1
  Filled 2022-01-23: qty 120, 30d supply, fill #2

## 2021-10-30 MED ORDER — FREESTYLE LIBRE 14 DAY READER DEVI
99 refills | Status: DC
Start: 2021-10-30 — End: 2022-02-28
  Filled 2021-10-30: qty 1, 1d supply, fill #0

## 2021-10-30 MED ORDER — SIMVASTATIN 40 MG PO TABS
40.0000 mg | ORAL_TABLET | Freq: Every day | ORAL | 0 refills | Status: DC
  Filled 2021-10-30: qty 30, 30d supply, fill #0
  Filled 2021-11-29: qty 30, 30d supply, fill #1
  Filled 2022-01-07: qty 30, 30d supply, fill #2

## 2021-10-30 MED ORDER — LOSARTAN POTASSIUM 50 MG PO TABS
50.0000 mg | ORAL_TABLET | Freq: Every day | ORAL | 0 refills | Status: DC
Start: 2021-10-30 — End: 2022-01-23
  Filled 2021-10-30: qty 30, 30d supply, fill #0
  Filled 2021-11-29: qty 30, 30d supply, fill #1
  Filled 2022-01-07: qty 30, 30d supply, fill #2

## 2021-10-30 NOTE — Telephone Encounter (Signed)
Johnathan Warner    Patient called to requests a pharmacy change and medication refills. He is scheduled in May to transfer care to you.   He is requesting his Pharmacy to be [facility] at [address]  The medications are as follows: Simvastatin, Losartan, Jardiance, Metformin, 14 day Insurance account manager and 14 day [employer]  Thank you [employer]

## 2021-10-30 NOTE — Telephone Encounter (Signed)
All medications refilled.

## 2021-11-29 ENCOUNTER — Other Ambulatory Visit (HOSPITAL_COMMUNITY): Payer: Self-pay

## 2021-12-26 LAB — HM DIABETES EYE EXAM

## 2021-12-31 ENCOUNTER — Ambulatory Visit: Payer: 59 | Admitting: Medical-Surgical

## 2021-12-31 NOTE — Progress Notes (Deleted)
  HPI with pertinent ROS:   CC: Transfer of care  HPI: Pleasant 56 year old male presenting today to transfer care to a new PCP and for the following:  I reviewed the past medical history, family history, social history, surgical history, and allergies today and no changes were needed.  Please see the problem list section below in epic for further details.   Physical exam:   General: Well Developed, well nourished, and in no acute distress.  Neuro: Alert and oriented x3.  HEENT: Normocephalic, atraumatic.  Skin: Warm and dry. Cardiac: Regular rate and rhythm, no murmurs rubs or gallops, no lower extremity edema.  Respiratory: Clear to auscultation bilaterally. Not using accessory muscles, speaking in full sentences.  Impression and Recommendations:    1. Essential hypertension ***  2. Hyperlipidemia associated with type 2 diabetes mellitus (HCC) ***  3. Type 2 diabetes mellitus with hyperglycemia, without long-term current use of insulin (HCC) ***   No follow-ups on file. ___________________________________________ Thayer Ohm, DNP, APRN, FNP-BC Primary Care and Sports Medicine [facility]

## 2022-01-07 ENCOUNTER — Other Ambulatory Visit (HOSPITAL_COMMUNITY): Payer: Self-pay

## 2022-01-23 ENCOUNTER — Other Ambulatory Visit: Payer: Self-pay | Admitting: Sports Medicine

## 2022-01-23 ENCOUNTER — Other Ambulatory Visit (HOSPITAL_COMMUNITY): Payer: Self-pay

## 2022-01-23 MED ORDER — EMPAGLIFLOZIN 25 MG PO TABS
25.0000 mg | ORAL_TABLET | Freq: Every day | ORAL | 0 refills | Status: DC
  Filled 2022-01-23 – 2022-02-04 (×2): qty 30, 30d supply, fill #0

## 2022-01-23 MED ORDER — SIMVASTATIN 40 MG PO TABS
40.0000 mg | ORAL_TABLET | Freq: Every day | ORAL | 0 refills | Status: DC
  Filled 2022-01-23 – 2022-02-04 (×2): qty 30, 30d supply, fill #0

## 2022-01-23 MED ORDER — LOSARTAN POTASSIUM 50 MG PO TABS
50.0000 mg | ORAL_TABLET | Freq: Every day | ORAL | 0 refills | Status: DC
Start: 2022-01-23 — End: 2022-02-28
  Filled 2022-01-23 – 2022-02-04 (×2): qty 30, 30d supply, fill #0

## 2022-01-23 NOTE — Telephone Encounter (Signed)
No further refills until he has attended his transfer of care appointment.

## 2022-01-30 ENCOUNTER — Other Ambulatory Visit: Payer: Self-pay | Admitting: Physician Assistant

## 2022-02-04 ENCOUNTER — Other Ambulatory Visit (HOSPITAL_COMMUNITY): Payer: Self-pay

## 2022-02-27 NOTE — Progress Notes (Unsigned)
   Established Patient Office Visit  Subjective   Patient ID: Georgi Navarrete, male   DOB: 01/02/1966 Age: 56 y.o. MRN: [id number]   No chief complaint on file.   HPI Pleasant 56 year old male presenting today to establish care with a new PCP and for the following:  DM:  HTN:  HLD:  ROS    Objective:    There were no vitals filed for this visit.   Physical Exam    No results found for this or any previous visit (from the past 24 hour(s)).   {Labs (Optional):23779}  The 10-year ASCVD risk score (Arnett DK, et al., 2019) is: 18.7%   Values used to calculate the score:     Age: 54 years     Sex: Male     Is Non-Hispanic African American: No     Diabetic: Yes     Tobacco smoker: No     Systolic Blood Pressure: 136 mmHg     Is BP treated: Yes     HDL Cholesterol: 36 mg/dL     Total Cholesterol: 195 mg/dL   Assessment & Plan:   No problem-specific Assessment & Plan notes found for this encounter.   No follow-ups on file.  ___________________________________________ Thayer Ohm, DNP, APRN, FNP-BC Primary Care and Sports Medicine [facility] [place]

## 2022-02-28 ENCOUNTER — Ambulatory Visit: Payer: 59 | Admitting: Medical-Surgical

## 2022-02-28 ENCOUNTER — Other Ambulatory Visit (HOSPITAL_COMMUNITY): Payer: Self-pay

## 2022-02-28 ENCOUNTER — Encounter: Payer: Self-pay | Admitting: Medical-Surgical

## 2022-02-28 VITALS — BP 135/86 | HR 104 | Wt 169.0 lb

## 2022-02-28 DIAGNOSIS — E1169 Type 2 diabetes mellitus with other specified complication: Secondary | ICD-10-CM | POA: Diagnosis not present

## 2022-02-28 DIAGNOSIS — I10 Essential (primary) hypertension: Secondary | ICD-10-CM

## 2022-02-28 DIAGNOSIS — Z1211 Encounter for screening for malignant neoplasm of colon: Secondary | ICD-10-CM

## 2022-02-28 DIAGNOSIS — E1165 Type 2 diabetes mellitus with hyperglycemia: Secondary | ICD-10-CM

## 2022-02-28 DIAGNOSIS — G8929 Other chronic pain: Secondary | ICD-10-CM

## 2022-02-28 DIAGNOSIS — E785 Hyperlipidemia, unspecified: Secondary | ICD-10-CM

## 2022-02-28 DIAGNOSIS — Z7689 Persons encountering health services in other specified circumstances: Secondary | ICD-10-CM

## 2022-02-28 DIAGNOSIS — Z23 Encounter for immunization: Secondary | ICD-10-CM | POA: Diagnosis not present

## 2022-02-28 DIAGNOSIS — M25512 Pain in left shoulder: Secondary | ICD-10-CM

## 2022-02-28 LAB — POCT UA - MICROALBUMIN
Albumin/Creatinine Ratio, Urine, POC: >300
Creatinine, POC: 10 mg/dL
Microalbumin Ur, POC: 150 mg/L

## 2022-02-28 LAB — POCT GLYCOSYLATED HEMOGLOBIN (HGB A1C): HbA1c, POC (controlled diabetic range): 8.3 % — AB (ref 0.0–7.0)

## 2022-02-28 MED ORDER — SITAGLIPTIN PHOSPHATE 100 MG PO TABS
100.0000 mg | ORAL_TABLET | Freq: Every day | ORAL | 1 refills | Status: DC
Start: 2022-02-28 — End: 2023-10-06
  Filled 2022-02-28: qty 30, 30d supply, fill #0

## 2022-02-28 MED ORDER — METFORMIN HCL ER 500 MG PO TB24
1000.0000 mg | ORAL_TABLET | Freq: Two times a day (BID) | ORAL | 1 refills | Status: DC
Start: 2022-02-28 — End: 2022-08-28
  Filled 2022-02-28: qty 120, 30d supply, fill #0
  Filled 2022-04-17: qty 120, 30d supply, fill #1
  Filled 2022-05-24: qty 120, 30d supply, fill #2
  Filled 2022-06-17: qty 120, 30d supply, fill #3
  Filled 2022-07-22: qty 120, 30d supply, fill #4

## 2022-02-28 MED ORDER — DEXCOM G6 TRANSMITTER MISC
1.0000 | Freq: Every day | 0 refills | Status: DC
  Filled 2022-02-28: qty 1, 90d supply, fill #0

## 2022-02-28 MED ORDER — LOSARTAN POTASSIUM 50 MG PO TABS
50.0000 mg | ORAL_TABLET | Freq: Every day | ORAL | 1 refills | Status: DC
Start: 2022-02-28 — End: 2022-08-28
  Filled 2022-02-28: qty 30, 30d supply, fill #0
  Filled 2022-04-17: qty 30, 30d supply, fill #1
  Filled 2022-05-24: qty 30, 30d supply, fill #2
  Filled 2022-06-17: qty 30, 30d supply, fill #3
  Filled 2022-07-22: qty 30, 30d supply, fill #4

## 2022-02-28 MED ORDER — SIMVASTATIN 40 MG PO TABS
40.0000 mg | ORAL_TABLET | Freq: Every day | ORAL | 3 refills | Status: DC
  Filled 2022-02-28: qty 30, 30d supply, fill #0
  Filled 2022-04-30: qty 30, 30d supply, fill #1
  Filled 2022-05-24: qty 30, 30d supply, fill #2
  Filled 2022-06-17: qty 30, 30d supply, fill #3
  Filled 2022-07-22: qty 30, 30d supply, fill #4

## 2022-02-28 MED ORDER — DEXCOM G6 SENSOR MISC
1.0000 | 0 refills | Status: DC
  Filled 2022-02-28: qty 3, 30d supply, fill #0

## 2022-02-28 MED ORDER — DEXCOM G6 RECEIVER DEVI
1.0000 | Freq: Every day | 0 refills | Status: DC
  Filled 2022-02-28 (×2): qty 1, 1d supply, fill #0

## 2022-02-28 MED ORDER — EMPAGLIFLOZIN 25 MG PO TABS
25.0000 mg | ORAL_TABLET | Freq: Every day | ORAL | 1 refills | Status: DC
Start: 2022-02-28 — End: 2022-08-28
  Filled 2022-02-28: qty 30, 30d supply, fill #0
  Filled 2022-04-17: qty 30, 30d supply, fill #1
  Filled 2022-05-24: qty 30, 30d supply, fill #2
  Filled 2022-06-17: qty 30, 30d supply, fill #3
  Filled 2022-07-22: qty 30, 30d supply, fill #4

## 2022-03-04 ENCOUNTER — Other Ambulatory Visit (HOSPITAL_COMMUNITY): Payer: Self-pay

## 2022-03-07 ENCOUNTER — Ambulatory Visit (INDEPENDENT_AMBULATORY_CARE_PROVIDER_SITE_OTHER): Payer: 59

## 2022-03-07 ENCOUNTER — Ambulatory Visit: Payer: 59 | Attending: Medical-Surgical | Admitting: Physical Therapy

## 2022-03-07 DIAGNOSIS — M25612 Stiffness of left shoulder, not elsewhere classified: Secondary | ICD-10-CM | POA: Diagnosis not present

## 2022-03-07 DIAGNOSIS — M25512 Pain in left shoulder: Secondary | ICD-10-CM | POA: Insufficient documentation

## 2022-03-07 DIAGNOSIS — M6281 Muscle weakness (generalized): Secondary | ICD-10-CM

## 2022-03-07 DIAGNOSIS — G8929 Other chronic pain: Secondary | ICD-10-CM | POA: Insufficient documentation

## 2022-03-07 DIAGNOSIS — R293 Abnormal posture: Secondary | ICD-10-CM | POA: Insufficient documentation

## 2022-03-07 NOTE — Therapy (Signed)
[facility] [place] [address] [place], [place], [address] Phone: [phone]   Fax:  [phone]  Physical Therapy Evaluation  Patient Details  Name: Johnathan Warner MRN: [id number] Date of Birth: 1966-07-15 Referring Provider (PT): Christen Butter, NP   Encounter Date: 03/07/2022   PT End of Session - 03/07/22 1506     Visit Number 1    Number of Visits 12    Date for PT Re-Evaluation 04/18/22    Authorization Type UHC    PT Start Time 0805    PT Stop Time 0845    PT Time Calculation (min) 40 min    Activity Tolerance Patient tolerated treatment well    Behavior During Therapy [facility] for tasks assessed/performed             Past Medical History:  Diagnosis Date   Diabetes mellitus without complication (HCC)    Hyperlipemia    Hypertension     No past surgical history on file.  There were no vitals filed for this visit.    Subjective Assessment - 03/07/22 0808     Subjective Pt reports L shoulder pain. Every time raising it upwards, twisting it downwards or reaching backwards it hurts. This has been ongoing for 6 months. No method of injury noted. Pt has not yet gotten an xray.    Limitations Lifting;House hold activities    How long can you sit comfortably? n/a    How long can you stand comfortably? n/a    How long can you walk comfortably? n/a    Patient Stated Goals Improve shoulder pain    Currently in Pain? No/denies   only with movement               [facility] PT Assessment - 03/07/22 0001       Assessment   Medical Diagnosis M25.512,G89.29 (ICD-10-CM) - Chronic left shoulder pain    Referring Provider (PT) Christen Butter, NP    Onset Date/Surgical Date --   ~6 month   Hand Dominance Right    Prior Therapy None      Precautions   Precautions None      Restrictions   Weight Bearing Restrictions No      Balance Screen   Has the patient fallen in the past 6 months No      Home Environment   Living  Environment Private residence    Living Arrangements Children;Spouse/significant other    Available Help at Discharge Family      Prior Function   Vocation Full time employment    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, applications      Observation/Other Assessments   Focus on Therapeutic Outcomes (FOTO)  50; predicted 69 visit 12      Posture/Postural Control   Posture/Postural Control Postural limitations    Postural Limitations Rounded Shoulders    Posture Comments anteriorly tilted and downwardly rotated scapulas bilat; high riding humerus L>R      ROM / Strength   AROM / PROM / Strength AROM;Strength      AROM   AROM Assessment Site Shoulder    Right/Left Shoulder Right;Left    Right Shoulder Extension 105 Degrees    Right Shoulder Flexion 160 Degrees    Right Shoulder ABduction 115 Degrees    Right Shoulder Internal Rotation --   To T12   Right Shoulder External Rotation --   To T2   Left Shoulder Extension 105  Degrees   painful   Left Shoulder Flexion 140 Degrees    Left Shoulder ABduction 115 Degrees    Left Shoulder Internal Rotation --   To L2   Left Shoulder External Rotation --   To C6 (painful)     Strength   Strength Assessment Site Shoulder    Right/Left Shoulder Left    Left Shoulder Flexion 5/5    Left Shoulder Extension 4/5    Left Shoulder ABduction 3-/5   painful on resistance   Left Shoulder Internal Rotation 5/5    Left Shoulder External Rotation 5/5    Left Shoulder Horizontal ABduction 3+/5      Palpation   Palpation comment hypomobile ant/post joint capsule at 90 deg abd                        Objective measurements completed on examination: See above findings.       [facility] Adult PT Treatment/Exercise - 03/07/22 0001       Shoulder Exercises: Standing   External Rotation Strengthening;Both;20 reps;Theraband    Theraband Level (Shoulder External Rotation) Level 2 (Red)    Extension  Strengthening;Both;20 reps;Theraband    Theraband Level (Shoulder Extension) Level 2 (Red)    Row Strengthening;Both;20 reps;Theraband    Theraband Level (Shoulder Row) Level 2 (Red)    Other Standing Exercises "W" red tband 2x10      Shoulder Exercises: Stretch   Other Shoulder Stretches Doorway pec stretch 60 and 90 deg x30 sec each                     PT Education - 03/07/22 1506     Education Details Exam findings, POC, HEP    Person(s) Educated Patient    Methods Explanation;Demonstration;Tactile cues;Verbal cues;Handout    Comprehension Verbalized understanding;Returned demonstration;Tactile cues required;Verbal cues required                 PT Long Term Goals - 03/07/22 1514       PT LONG TERM GOAL #1   Title Pt will be independent with HEP    Time 6    Period Weeks    Status New    Target Date 04/18/22      PT LONG TERM GOAL #2   Title Pt will have L = R shoulder ROM with no pain    Time 6    Period Weeks    Status New    Target Date 04/18/22      PT LONG TERM GOAL #3   Title Pt will be able to lift at least 5# overhead to reach into kitchen cabinets    Time 6    Period Weeks    Status New    Target Date 04/18/22      PT LONG TERM GOAL #4   Title Pt will be independent with maintaining postural stabilization for improved body mechanics at work    Time 6    Period Weeks    Status New    Target Date 04/18/22      PT LONG TERM GOAL #5   Title Pt will have increased FOTO to 69    Time 6    Period Weeks    Status New    Target Date 04/18/22                    Plan - 03/07/22 1507     Clinical Impression Statement Mr.  Dipasquale is a 56 y/o M presenting to OPPT due to L shoulder pain. On assessment, pt demos scapular dyskinesis with anteriorly tilted scapula, decreased posterior shoulder/scapular strength resulting in decreased ROM and impingement. No overt tenderness to palpation; however, ER/IR limited in shoulder abduction. Pt  would benefit from PT to improve shoulder mechanics and strength for improved overhead movements and lifting.    Personal Factors and Comorbidities Age;Fitness;Time since onset of injury/illness/exacerbation;Profession    Examination-Activity Limitations Carry;Lift;Toileting;Hygiene/Grooming    Examination-Participation Restrictions Community Activity;Occupation    Stability/Clinical Decision Making Stable/Uncomplicated    Clinical Decision Making Low    Rehab Potential Good    PT Frequency 2x / week    PT Duration 6 weeks    PT Treatment/Interventions ADLs/Self Care Home Management;Cryotherapy;Electrical Stimulation;Iontophoresis 4mg /ml Dexamethasone;Moist Heat;Functional mobility training;Therapeutic exercise;Therapeutic activities;Neuromuscular re-education;Manual techniques;Passive range of motion;Dry needling;Vasopneumatic Device;Taping    PT Next Visit Plan Assess response to HEP. Pulley for warm up. Continue to strengthen posterior shoulder girdle. Manual work as indicated for minor and joint mobility.    PT Home Exercise Plan [id number]    Consulted and Agree with Plan of Care Patient             Patient will benefit from skilled therapeutic intervention in order to improve the following deficits and impairments:  Decreased coordination, Decreased range of motion, Impaired UE functional use, Pain, Improper body mechanics, Decreased mobility, Decreased strength, Postural dysfunction  Visit Diagnosis: Chronic left shoulder pain  Stiffness of left shoulder, not elsewhere classified  Abnormal posture  Muscle weakness (generalized)     Problem List Patient Active Problem List   Diagnosis Date Noted   Otitis externa 06/05/2018   History of dizziness 05/28/2018   Tachycardia with heart rate 100-120 beats per minute 05/28/2018   Type 2 diabetes mellitus with hyperglycemia, without long-term current use of insulin (HCC) 10/23/2016   Essential hypertension 10/23/2016    Hyperlipidemia associated with type 2 diabetes mellitus [facility]) 10/23/2016    Zoria Rawlinson April May, PT, DPT 03/07/2022, 3:16 PM  [facility] [address] [place], [place], [place] Phone: [phone]   Fax:  [phone]  Name: Brendan Gadson MRN: Irving Burton Date of Birth: [birth year]

## 2022-04-17 ENCOUNTER — Other Ambulatory Visit (HOSPITAL_COMMUNITY): Payer: Self-pay

## 2022-04-18 ENCOUNTER — Other Ambulatory Visit (HOSPITAL_COMMUNITY): Payer: Self-pay

## 2022-04-30 ENCOUNTER — Other Ambulatory Visit (HOSPITAL_COMMUNITY): Payer: Self-pay

## 2022-05-24 ENCOUNTER — Other Ambulatory Visit (HOSPITAL_COMMUNITY): Payer: Self-pay

## 2022-06-16 NOTE — Progress Notes (Unsigned)
   Established Patient Office Visit  Subjective   Patient ID: Hayne Schaver, male   DOB: [birth year] Age: 56 y.o. MRN: [id number]   No chief complaint on file.   HPI Pleasant 56 year old male presenting today for the following:  DM:  HTN:  HLD:   Objective:    There were no vitals filed for this visit.  Physical Exam   No results found for this or any previous visit (from the past 24 hour(s)).   {Labs (Optional):23779}  The 10-year ASCVD risk score (Arnett DK, et al., 2019) is: 19.8%   Values used to calculate the score:     Age: 49 years     Sex: Male     Is Non-Hispanic African American: No     Diabetic: Yes     Tobacco smoker: No     Systolic Blood Pressure: 135 mmHg     Is BP treated: Yes     HDL Cholesterol: 36 mg/dL     Total Cholesterol: 195 mg/dL   Assessment & Plan:   No problem-specific Assessment & Plan notes found for this encounter.   No follow-ups on file.  ___________________________________________ Thayer Ohm, DNP, APRN, FNP-BC Primary Care and Sports Medicine [facility]

## 2022-06-17 ENCOUNTER — Ambulatory Visit: Payer: 59 | Admitting: Medical-Surgical

## 2022-06-17 ENCOUNTER — Other Ambulatory Visit (HOSPITAL_COMMUNITY): Payer: Self-pay

## 2022-06-17 DIAGNOSIS — I10 Essential (primary) hypertension: Secondary | ICD-10-CM

## 2022-06-17 DIAGNOSIS — E1165 Type 2 diabetes mellitus with hyperglycemia: Secondary | ICD-10-CM

## 2022-06-17 DIAGNOSIS — Z91199 Patient's noncompliance with other medical treatment and regimen due to unspecified reason: Secondary | ICD-10-CM

## 2022-06-17 DIAGNOSIS — E1169 Type 2 diabetes mellitus with other specified complication: Secondary | ICD-10-CM

## 2022-06-18 ENCOUNTER — Other Ambulatory Visit (HOSPITAL_COMMUNITY): Payer: Self-pay

## 2022-06-19 ENCOUNTER — Other Ambulatory Visit: Payer: Self-pay | Admitting: Medical-Surgical

## 2022-06-19 ENCOUNTER — Other Ambulatory Visit: Payer: Self-pay

## 2022-06-19 ENCOUNTER — Other Ambulatory Visit (HOSPITAL_COMMUNITY): Payer: Self-pay

## 2022-06-19 DIAGNOSIS — E1165 Type 2 diabetes mellitus with hyperglycemia: Secondary | ICD-10-CM

## 2022-06-19 MED ORDER — FREESTYLE LITE W/DEVICE KIT
1.0000 | PACK | 0 refills | Status: AC | PRN
  Filled 2022-06-19: qty 1, 1d supply, fill #0

## 2022-06-19 MED ORDER — GLUCOSE BLOOD VI STRP
ORAL_STRIP | 99 refills | Status: DC
  Filled 2022-06-19: qty 100, 25d supply, fill #0

## 2022-06-19 MED ORDER — FREESTYLE LANCETS MISC
99 refills | Status: DC
  Filled 2022-06-19: qty 100, 25d supply, fill #0

## 2022-06-19 NOTE — Telephone Encounter (Signed)
Patient needs appointment for further refills.  Last office visit 02/28/2022  No Show 06/17/2022

## 2022-06-19 NOTE — Telephone Encounter (Signed)
I sent these to the pharmacy and he is scheduling an appointment in December.

## 2022-07-22 ENCOUNTER — Other Ambulatory Visit (HOSPITAL_COMMUNITY): Payer: Self-pay

## 2022-08-02 ENCOUNTER — Other Ambulatory Visit: Payer: Self-pay

## 2022-08-02 DIAGNOSIS — E1165 Type 2 diabetes mellitus with hyperglycemia: Secondary | ICD-10-CM

## 2022-08-08 ENCOUNTER — Encounter: Payer: 59 | Admitting: Sports Medicine

## 2022-08-12 ENCOUNTER — Other Ambulatory Visit: Payer: Self-pay

## 2022-08-12 DIAGNOSIS — E1165 Type 2 diabetes mellitus with hyperglycemia: Secondary | ICD-10-CM

## 2022-08-13 ENCOUNTER — Ambulatory Visit: Payer: 59 | Admitting: Medical-Surgical

## 2022-08-27 ENCOUNTER — Other Ambulatory Visit (HOSPITAL_COMMUNITY): Payer: Self-pay

## 2022-08-27 ENCOUNTER — Ambulatory Visit: Payer: 59 | Admitting: Sports Medicine

## 2022-08-27 ENCOUNTER — Ambulatory Visit (INDEPENDENT_AMBULATORY_CARE_PROVIDER_SITE_OTHER): Payer: 59

## 2022-08-27 DIAGNOSIS — E1165 Type 2 diabetes mellitus with hyperglycemia: Secondary | ICD-10-CM

## 2022-08-27 DIAGNOSIS — G8929 Other chronic pain: Secondary | ICD-10-CM

## 2022-08-27 DIAGNOSIS — M19011 Primary osteoarthritis, right shoulder: Secondary | ICD-10-CM | POA: Diagnosis not present

## 2022-08-27 DIAGNOSIS — M545 Low back pain, unspecified: Secondary | ICD-10-CM

## 2022-08-27 DIAGNOSIS — M19012 Primary osteoarthritis, left shoulder: Secondary | ICD-10-CM

## 2022-08-27 MED ORDER — MELOXICAM 15 MG PO TABS
ORAL_TABLET | ORAL | 3 refills | Status: AC
  Filled 2022-08-27: qty 30, 30d supply, fill #0

## 2022-08-27 NOTE — Assessment & Plan Note (Addendum)
Does have diabetes, does need to lose some weight, I have suggested GLP-1 treatment, Rybelsus versus an injectable, he will think about it and let his PCP know.

## 2022-08-27 NOTE — Assessment & Plan Note (Signed)
Also having axial low back pain worse with sitting, flexion, Valsalva, no red flag symptoms or signs, nothing radicular. Meloxicam as below, formal PT, x-rays, return to see me in 6 weeks, MR for interventional planning if no better.

## 2022-08-27 NOTE — Assessment & Plan Note (Signed)
This is a pleasant 56 year old male, he has bilateral shoulder pain left worse than right, he did have some x-rays done of both shoulders in the past that showed arthritis although not mentioned by radiology for the left shoulder. On exam he has significant loss of external rotation bilaterally, only minimal impingement signs. I think the principal pain generator is the glenohumeral joint. We will start with meloxicam, formal physical therapy, return to see me in about 6 weeks and we can do a glenohumeral joint injection if not better.

## 2022-08-27 NOTE — Progress Notes (Signed)
    Procedures performed today:    None.  Independent interpretation of notes and tests performed by another provider:   Left and right shoulder x-rays personally viewed, there is some calcific tendinopathy at the supraspinatus insertion, there is also glenohumeral spurring consistent with osteoarthritis.  Brief History, Exam, Impression, and Recommendations:    Primary osteoarthritis of both shoulders This is a pleasant 56 year old male, he has bilateral shoulder pain left worse than right, he did have some x-rays done of both shoulders in the past that showed arthritis although not mentioned by radiology for the left shoulder. On exam he has significant loss of external rotation bilaterally, only minimal impingement signs. I think the principal pain generator is the glenohumeral joint. We will start with meloxicam, formal physical therapy, return to see me in about 6 weeks and we can do a glenohumeral joint injection if not better.  Chronic low back pain without sciatica Also having axial low back pain worse with sitting, flexion, Valsalva, no red flag symptoms or signs, nothing radicular. Meloxicam as below, formal PT, x-rays, return to see me in 6 weeks, MR for interventional planning if no better.  Type 2 diabetes mellitus with hyperglycemia, without long-term current use of insulin (HCC) Does have diabetes, does need to lose some weight, I have suggested GLP-1 treatment, Rybelsus versus an injectable, he will think about it and let his PCP know.    ____________________________________________ Johnathan Warner. Benjamin Stain, M.D., ABFM., CAQSM., AME. Primary Care and Sports Medicine [facility]  Adjunct Professor of Family Medicine  [employer]  Restaurant manager, fast food

## 2022-08-28 ENCOUNTER — Other Ambulatory Visit (HOSPITAL_COMMUNITY): Payer: Self-pay

## 2022-08-28 ENCOUNTER — Ambulatory Visit: Payer: 59 | Admitting: Medical-Surgical

## 2022-08-28 ENCOUNTER — Encounter: Payer: Self-pay | Admitting: Medical-Surgical

## 2022-08-28 VITALS — BP 122/77 | HR 82 | Resp 20 | Ht 66.0 in | Wt 165.4 lb

## 2022-08-28 DIAGNOSIS — E1169 Type 2 diabetes mellitus with other specified complication: Secondary | ICD-10-CM

## 2022-08-28 DIAGNOSIS — Z23 Encounter for immunization: Secondary | ICD-10-CM | POA: Diagnosis not present

## 2022-08-28 DIAGNOSIS — E1165 Type 2 diabetes mellitus with hyperglycemia: Secondary | ICD-10-CM

## 2022-08-28 DIAGNOSIS — I10 Essential (primary) hypertension: Secondary | ICD-10-CM | POA: Diagnosis not present

## 2022-08-28 DIAGNOSIS — R209 Unspecified disturbances of skin sensation: Secondary | ICD-10-CM | POA: Insufficient documentation

## 2022-08-28 DIAGNOSIS — E785 Hyperlipidemia, unspecified: Secondary | ICD-10-CM | POA: Diagnosis not present

## 2022-08-28 DIAGNOSIS — Z1211 Encounter for screening for malignant neoplasm of colon: Secondary | ICD-10-CM

## 2022-08-28 MED ORDER — FREESTYLE LANCETS MISC
Freq: Four times a day (QID) | 99 refills | Status: AC
  Filled 2022-08-28 – 2022-12-16 (×2): qty 100, 25d supply, fill #0

## 2022-08-28 MED ORDER — SIMVASTATIN 40 MG PO TABS
40.0000 mg | ORAL_TABLET | Freq: Every day | ORAL | 3 refills | Status: DC
  Filled 2022-08-28: qty 30, 30d supply, fill #0
  Filled 2022-10-12: qty 30, 30d supply, fill #1
  Filled 2022-11-13: qty 30, 30d supply, fill #2
  Filled 2022-12-16: qty 30, 30d supply, fill #4
  Filled 2022-12-16: qty 30, 30d supply, fill #3
  Filled 2023-01-15: qty 30, 30d supply, fill #4
  Filled 2023-02-12: qty 30, 30d supply, fill #5
  Filled 2023-03-16: qty 30, 30d supply, fill #6
  Filled 2023-04-29: qty 30, 30d supply, fill #7

## 2022-08-28 MED ORDER — LOSARTAN POTASSIUM 50 MG PO TABS
50.0000 mg | ORAL_TABLET | Freq: Every day | ORAL | 1 refills | Status: DC
  Filled 2022-08-28: qty 30, 30d supply, fill #0
  Filled 2022-10-12: qty 30, 30d supply, fill #1
  Filled 2022-11-13: qty 30, 30d supply, fill #2
  Filled 2022-12-16: qty 30, 30d supply, fill #4
  Filled 2022-12-16: qty 30, 30d supply, fill #3
  Filled 2023-01-15: qty 30, 30d supply, fill #4
  Filled 2023-02-12: qty 30, 30d supply, fill #5

## 2022-08-28 MED ORDER — GLUCOSE BLOOD VI STRP
ORAL_STRIP | Freq: Four times a day (QID) | 99 refills | Status: AC
  Filled 2022-08-28 – 2022-12-16 (×2): qty 100, 25d supply, fill #0

## 2022-08-28 MED ORDER — EMPAGLIFLOZIN 25 MG PO TABS
25.0000 mg | ORAL_TABLET | Freq: Every day | ORAL | 1 refills | Status: DC
  Filled 2022-08-28: qty 30, 30d supply, fill #0
  Filled 2022-10-12: qty 30, 30d supply, fill #1
  Filled 2022-11-13: qty 30, 30d supply, fill #2
  Filled 2022-12-16: qty 30, 30d supply, fill #4
  Filled 2022-12-16: qty 30, 30d supply, fill #3
  Filled 2023-01-15: qty 30, 30d supply, fill #4
  Filled 2023-02-12: qty 30, 30d supply, fill #5

## 2022-08-28 MED ORDER — METFORMIN HCL ER 500 MG PO TB24
1000.0000 mg | ORAL_TABLET | Freq: Two times a day (BID) | ORAL | 1 refills | Status: DC
  Filled 2022-08-28: qty 120, 30d supply, fill #0
  Filled 2022-10-12: qty 120, 30d supply, fill #1

## 2022-08-28 NOTE — Progress Notes (Signed)
Established Patient Office Visit  Subjective   Patient ID: Johnathan Warner, male   DOB: [birth year] Age: 56 y.o. MRN: [id number]   Chief Complaint  Patient presents with   Diabetes   Follow-up    HPI Pleasant 56 year old male presenting today for the following:  Hypertension: Medication: losartan 50mg  daily Compliant: Yes Side effects: No Checking BP at home: sometimes, readings 120s-130s/70s Low sodium diet: most of the time Exercise: None related to back/shoulder pain Concerning symptoms: None  Diabetes: Type: 2 Medications: Metformin 1000 BID, Januvia 100mg , Jardiance 25mg  Compliant: yes Side effects: None Checking sugars: sometimes, readings 190-200 Diabetic diet: Sometimes Complications: none Eye exam: UTD Foot exam: UTD Microalbumin screening: UTD Statin: yes ACE/ARB: Yes Last A1c: 02/28/2022 was 8.3%   Objective:    Vitals:   08/28/22 1024  BP: 122/77  Pulse: 82  Resp: 20  Height: 5\' 6"  (1.676 m)  Weight: 165 lb 6.4 oz (75 kg)  SpO2: 96%  BMI (Calculated): 26.71    Physical Exam Vitals reviewed.  Constitutional:      General: He is not in acute distress.    Appearance: Normal appearance. He is not ill-appearing.  HENT:     Head: Normocephalic and atraumatic.  Cardiovascular:     Rate and Rhythm: Normal rate and regular rhythm.     Pulses: Normal pulses.     Heart sounds: Normal heart sounds. No murmur heard.    No friction rub. No gallop.  Pulmonary:     Effort: Pulmonary effort is normal. No respiratory distress.     Breath sounds: Normal breath sounds.  Skin:    General: Skin is warm and dry.  Neurological:     Mental Status: He is alert and oriented to person, place, and time.  Psychiatric:        Mood and Affect: Mood normal.        Behavior: Behavior normal.        Thought Content: Thought content normal.        Judgment: Judgment normal.   No results found for this or any previous visit (from the past 24 hour(s)).     The  10-year ASCVD risk score (Arnett DK, et al., 2019) is: 16.8%   Values used to calculate the score:     Age: 73 years     Sex: Male     Is Non-Hispanic African American: No     Diabetic: Yes     Tobacco smoker: No     Systolic Blood Pressure: 122 mmHg     Is BP treated: Yes     HDL Cholesterol: 36 mg/dL     Total Cholesterol: 195 mg/dL   Assessment & Plan:   1. Type 2 diabetes mellitus with hyperglycemia, without long-term current use of insulin (HCC) Attempted to check POCT hemoglobin A1c but machine error occurred.  Checking labs as below and will add A1c on.  Continue metformin, Jardiance, and Januvia as prescribed.  Refilling glucose test strips and lancets. - CBC - COMPLETE METABOLIC PANEL WITH GFR - Lipid panel - HgB A1c - empagliflozin (JARDIANCE) 25 MG TABS tablet; Take 1 tablet (25 mg total) by mouth daily.  Dispense: 90 tablet; Refill: 1 - glucose blood test strip; Use up to 4 (four) times daily to check blood sugar  Dispense: 100 strip; Refill: 99 - Lancets (FREESTYLE) lancets; Check glucose up to 4 (four) times daily.  Dispense: 100 each; Refill: PRN - metFORMIN (GLUCOPHAGE-XR) 500 MG 24 hr tablet; Take 2  tablets (1,000 mg total) by mouth 2 (two) times daily.  Dispense: 360 tablet; Refill: 1  2. Essential hypertension Checking labs as below.  Blood pressure at goal today.  Continue losartan 50 mg daily as prescribed. Continue to monitor BP at home with a goal of 130/80 or less. Recommend low sodium diet, regular intentional exercise, and maintaining a healthy weight.  - CBC - COMPLETE METABOLIC PANEL WITH GFR - Lipid panel  3. Hyperlipidemia associated with type 2 diabetes mellitus (HCC) Checking labs as below.  Continue simvastatin 40 mg daily. - COMPLETE METABOLIC PANEL WITH GFR - Lipid panel - simvastatin (ZOCOR) 40 MG tablet; Take 1 tablet (40 mg total) by mouth daily.  Dispense: 90 tablet; Refill: 3  4. Colon cancer screening Ordering Cologuard today. -  Cologuard  5. Need for immunization against influenza Flu vaccine given in office today. - Flu Vaccine QUAD 13mo+IM (Fluarix, Fluzone & Alfiuria Quad PF)   Return for Diabetes/hypertension/hyperlipidemia follow-up pending A1c results.  ___________________________________________ Thayer Ohm, DNP, APRN, FNP-BC Primary Care and Sports Medicine [facility]

## 2022-08-29 LAB — COMPLETE METABOLIC PANEL WITH GFR
AG Ratio: 1.6 (calc) (ref 1.0–2.5)
ALT: 25 U/L (ref 9–46)
AST: 19 U/L (ref 10–35)
Albumin: 4.6 g/dL (ref 3.6–5.1)
Alkaline phosphatase (APISO): 71 U/L (ref 35–144)
BUN: 20 mg/dL (ref 7–25)
CO2: 28 mmol/L (ref 20–32)
Calcium: 10.2 mg/dL (ref 8.6–10.3)
Chloride: 98 mmol/L (ref 98–110)
Creat: 1.04 mg/dL (ref 0.70–1.30)
Globulin: 2.9 g/dL (calc) (ref 1.9–3.7)
Glucose, Bld: 180 mg/dL — ABNORMAL HIGH (ref 65–99)
Potassium: 4.6 mmol/L (ref 3.5–5.3)
Sodium: 137 mmol/L (ref 135–146)
Total Bilirubin: 0.7 mg/dL (ref 0.2–1.2)
Total Protein: 7.5 g/dL (ref 6.1–8.1)
eGFR: 84 mL/min/{1.73_m2} (ref >=60)

## 2022-08-29 LAB — LIPID PANEL
Cholesterol: 230 mg/dL — ABNORMAL HIGH (ref <200)
HDL: 37 mg/dL — ABNORMAL LOW (ref >=40)
LDL Cholesterol (Calc): 136 mg/dL (calc) — ABNORMAL HIGH
Non-HDL Cholesterol (Calc): 193 mg/dL (calc) — ABNORMAL HIGH (ref <130)
Total CHOL/HDL Ratio: 6.2 (calc) — ABNORMAL HIGH (ref <5.0)
Triglycerides: 370 mg/dL — ABNORMAL HIGH (ref <150)

## 2022-08-29 LAB — CBC
HCT: 50.2 % — ABNORMAL HIGH (ref 38.5–50.0)
Hemoglobin: 16.7 g/dL (ref 13.2–17.1)
MCH: 31.2 pg (ref 27.0–33.0)
MCHC: 33.3 g/dL (ref 32.0–36.0)
MCV: 93.8 fL (ref 80.0–100.0)
MPV: 10.2 fL (ref 7.5–12.5)
Platelets: 287 10*3/uL (ref 140–400)
RBC: 5.35 10*6/uL (ref 4.20–5.80)
RDW: 12.6 % (ref 11.0–15.0)
WBC: 6.1 10*3/uL (ref 3.8–10.8)

## 2022-08-29 LAB — HEMOGLOBIN A1C
Hgb A1c MFr Bld: 8.9 % of total Hgb — ABNORMAL HIGH (ref <5.7)
Mean Plasma Glucose: 209 mg/dL
eAG (mmol/L): 11.6 mmol/L

## 2022-09-05 ENCOUNTER — Other Ambulatory Visit (HOSPITAL_COMMUNITY): Payer: Self-pay

## 2022-09-06 ENCOUNTER — Other Ambulatory Visit (HOSPITAL_COMMUNITY): Payer: Self-pay

## 2022-10-07 ENCOUNTER — Emergency Department (HOSPITAL_COMMUNITY): Payer: 59

## 2022-10-07 ENCOUNTER — Encounter (HOSPITAL_COMMUNITY): Payer: Self-pay

## 2022-10-07 ENCOUNTER — Emergency Department (HOSPITAL_COMMUNITY)
Admission: EM | Admit: 2022-10-07 | Discharge: 2022-10-07 | Disposition: A | Discharge status: Home / Self Care | Payer: 59 | Attending: Emergency Medicine | Admitting: Emergency Medicine

## 2022-10-07 ENCOUNTER — Other Ambulatory Visit: Payer: Self-pay

## 2022-10-07 DIAGNOSIS — R42 Dizziness and giddiness: Secondary | ICD-10-CM | POA: Insufficient documentation

## 2022-10-07 DIAGNOSIS — R112 Nausea with vomiting, unspecified: Secondary | ICD-10-CM | POA: Diagnosis not present

## 2022-10-07 DIAGNOSIS — Z7982 Long term (current) use of aspirin: Secondary | ICD-10-CM | POA: Diagnosis not present

## 2022-10-07 DIAGNOSIS — R1013 Epigastric pain: Secondary | ICD-10-CM | POA: Diagnosis not present

## 2022-10-07 LAB — CBC
HCT: 53.3 % — ABNORMAL HIGH (ref 39.0–52.0)
Hemoglobin: 17.4 g/dL — ABNORMAL HIGH (ref 13.0–17.0)
MCH: 30.5 pg (ref 26.0–34.0)
MCHC: 32.6 g/dL (ref 30.0–36.0)
MCV: 93.5 fL (ref 80.0–100.0)
Platelets: 330 10*3/uL (ref 150–400)
RBC: 5.7 MIL/uL (ref 4.22–5.81)
RDW: 11.7 % (ref 11.5–15.5)
WBC: 13 10*3/uL — ABNORMAL HIGH (ref 4.0–10.5)
nRBC: 0 % (ref 0.0–0.2)

## 2022-10-07 LAB — BASIC METABOLIC PANEL
Anion gap: 17 — ABNORMAL HIGH (ref 5–15)
BUN: 15 mg/dL (ref 6–20)
CO2: 20 mmol/L — ABNORMAL LOW (ref 22–32)
Calcium: 9.6 mg/dL (ref 8.9–10.3)
Chloride: 98 mmol/L (ref 98–111)
Creatinine, Ser: 1.01 mg/dL (ref 0.61–1.24)
GFR, Estimated: >60 mL/min (ref >60)
Glucose, Bld: 229 mg/dL — ABNORMAL HIGH (ref 70–99)
Potassium: 4.1 mmol/L (ref 3.5–5.1)
Sodium: 135 mmol/L (ref 135–145)

## 2022-10-07 LAB — CBG MONITORING, ED: Glucose-Capillary: 246 mg/dL — ABNORMAL HIGH (ref 70–99)

## 2022-10-07 LAB — TROPONIN I (HIGH SENSITIVITY)
Troponin I (High Sensitivity): 3 ng/L (ref <18)
Troponin I (High Sensitivity): 4 ng/L (ref <18)

## 2022-10-07 MED ORDER — MECLIZINE HCL 25 MG PO TABS: 25.0000 mg | ORAL_TABLET | Freq: Three times a day (TID) | ORAL | 0 refills | Status: AC | PRN

## 2022-10-07 MED ORDER — MECLIZINE HCL 25 MG PO TABS
25.0000 mg | ORAL_TABLET | Freq: Once | ORAL | Status: AC
  Administered 2022-10-07: 25 mg via ORAL
  Filled 2022-10-07: qty 1

## 2022-10-07 NOTE — ED Provider Notes (Signed)
[facility] Provider Note   CSN: [id number] Arrival date & time: 10/07/22  1314     History  Chief Complaint  Patient presents with   Dizziness   Gait Problem    Johnathan Warner is a 57 y.o. male.  57 year old male presents with acute onset of dizziness began 2 days ago.  According to the wife, who is a Designer, jewellery, patient was looking down on his symptoms started.  Has had some ear fullness and his wife treated him for bilateral otitis.  No headaches.  Has had some nausea and vomiting.  Patient has been off balance when he walks.  No prior history of same.  Had some epigastric pain after he had emesis today.  Denies any cardiac symptoms.       Home Medications Prior to Admission medications   Medication Sig Start Date End Date Taking? Authorizing Provider  acetaminophen (TYLENOL) 500 MG tablet Take 500 mg by mouth every 6 (six) hours as needed.    [provider]  aspirin EC 81 MG tablet Take 1 tablet (81 mg total) by mouth daily. 02/26/19   Emeterio Reeve, DO  betamethasone dipropionate 0.05 % cream Apply topically 2 (two) times daily to affected eczema area(s) as needed 05/02/21   Emeterio Reeve, DO  Blood Glucose Monitoring Suppl (FREESTYLE LITE) w/Device KIT Use as directed 06/19/22   Samuel Bouche, NP  empagliflozin (JARDIANCE) 25 MG TABS tablet Take 1 tablet (25 mg total) by mouth daily. 08/28/22   Samuel Bouche, NP  fexofenadine (ALLEGRA ALLERGY) 180 MG tablet Take 1 tablet (180 mg total) by mouth daily. 07/07/21 08/06/21  Eliezer Lofts, FNP  glucose blood test strip Use up to 4 (four) times daily to check blood sugar 08/28/22   Samuel Bouche, NP  guaiFENesin (ROBITUSSIN) 100 MG/5ML liquid Take 5 mLs by mouth every 4 (four) hours as needed for cough or to loosen phlegm.    [provider]  Lancets (FREESTYLE) lancets Check glucose up to 4 (four) times daily. 08/28/22   Samuel Bouche, NP  losartan (COZAAR) 50 MG  tablet Take 1 tablet (50 mg total) by mouth daily. 08/28/22   Samuel Bouche, NP  meloxicam (MOBIC) 15 MG tablet take 1 tablet, by mouth, every 24 hours with a meal for 2 weeks, then once every 24 hours if needed for pain. 08/27/22   Silverio Decamp, MD  metFORMIN (GLUCOPHAGE-XR) 500 MG 24 hr tablet Take 2 tablets (1,000 mg total) by mouth 2 (two) times daily. 08/28/22   Samuel Bouche, NP  sildenafil (VIAGRA) 100 MG tablet Take 0.5-1 tablets (50-100 mg total) by mouth as needed for erectile dysfunction. 03/31/20 03/31/21  Emeterio Reeve, DO  simvastatin (ZOCOR) 40 MG tablet Take 1 tablet (40 mg total) by mouth daily. 08/28/22   Samuel Bouche, NP  sitaGLIPtin (JANUVIA) 100 MG tablet Take 1 tablet (100 mg total) by mouth daily. 02/28/22   Samuel Bouche, NP  triamcinolone cream (KENALOG) 0.1 % Apply 1 application topically 2 (two) times daily. 04/09/20   Noe Gens, PA-C  trimethoprim-polymyxin b (POLYTRIM) ophthalmic solution Place 1 drop into both eyes 4 (four) times daily for 7 days 07/03/21   Samuel Bouche, NP      Allergies    Sulfa antibiotics; Amlodipine; Atorvastatin; Celecoxib; Hydrochlorothiazide; Lisinopril; Simvastatin; Sulfamethoxazole; Sulfonamide derivatives; and Tetanus toxoid, adsorbed    Review of Systems   Review of Systems  All other systems reviewed and are negative.   Physical  Exam Updated Vital Signs BP (!) 153/97   Pulse 90   Temp 98.3 F (36.8 C) (Oral)   Resp 14   SpO2 93%  Physical Exam Vitals and nursing note reviewed.  Constitutional:      General: He is not in acute distress.    Appearance: Normal appearance. He is well-developed. He is not toxic-appearing.  HENT:     Head: Normocephalic and atraumatic.     Right Ear: External ear normal.     Left Ear: External ear normal.     Ears:     Comments: Bilateral TMs showed decreased light reflex Eyes:     General: Lids are normal.     Conjunctiva/sclera: Conjunctivae normal.     Pupils: Pupils are equal,  round, and reactive to light.  Neck:     Thyroid: No thyroid mass.     Trachea: No tracheal deviation.  Cardiovascular:     Rate and Rhythm: Normal rate and regular rhythm.     Heart sounds: Normal heart sounds. No murmur heard.    No gallop.  Pulmonary:     Effort: Pulmonary effort is normal. No respiratory distress.     Breath sounds: Normal breath sounds. No stridor. No decreased breath sounds, wheezing, rhonchi or rales.  Abdominal:     General: There is no distension.     Palpations: Abdomen is soft.     Tenderness: There is no abdominal tenderness. There is no rebound.  Musculoskeletal:        General: No tenderness. Normal range of motion.     Cervical back: Normal range of motion and neck supple.  Skin:    General: Skin is warm and dry.     Findings: No abrasion or rash.  Neurological:     Mental Status: He is alert and oriented to person, place, and time. Mental status is at baseline.     GCS: GCS eye subscore is 4. GCS verbal subscore is 5. GCS motor subscore is 6.     Cranial Nerves: Cranial nerves 2-12 are intact. No cranial nerve deficit.     Sensory: No sensory deficit.     Motor: Motor function is intact.     Gait: Gait abnormal.  Psychiatric:        Attention and Perception: Attention normal.        Speech: Speech normal.        Behavior: Behavior normal.     ED Results / Procedures / Treatments   Labs (all labs ordered are listed, but only abnormal results are displayed) Labs Reviewed  BASIC METABOLIC PANEL - Abnormal; Notable for the following components:      Result Value   CO2 20 (*)    Glucose, Bld 229 (*)    Anion gap 17 (*)    All other components within normal limits  CBC - Abnormal; Notable for the following components:   WBC 13.0 (*)    Hemoglobin 17.4 (*)    HCT 53.3 (*)    All other components within normal limits  CBG MONITORING, ED - Abnormal; Notable for the following components:   Glucose-Capillary 246 (*)    All other components  within normal limits  URINALYSIS, ROUTINE W REFLEX MICROSCOPIC    EKG None  Radiology No results found.  Procedures Procedures    Medications Ordered in ED Medications  meclizine (ANTIVERT) tablet 25 mg (has no administration in time range)    ED Course/ Medical Decision Making/ A&P  Medical Decision Making Amount and/or Complexity of Data Reviewed Radiology: ordered.  Patient is EKG per interpretation shows no signs of acute ischemic changes.  Patient troponins x 2 negative.  Concern for possible central vertigo and patient MRI of his brain which per my interpretation did not show any acute findings. Patient given Antivert with good response here.  Will be to give patient prescription for Antivert and follow-up with his doctor as needed.        Final Clinical Impression(s) / ED Diagnoses Final diagnoses:  None    Rx / DC Orders ED Discharge Orders     None         Lacretia Leigh, MD 10/07/22 2124

## 2022-10-07 NOTE — ED Notes (Signed)
Lab called to add on troponin to initial labs; will then draw 2hr troponin when pt returns from MRI

## 2022-10-07 NOTE — ED Provider Triage Note (Signed)
Emergency Medicine Provider Triage Evaluation Note  Johnathan Warner , a 57 y.o. male  was evaluated in triage.  Pt complains of dizziness and unsteady gait for the last 2 days.  States that dizziness is more like a spinning and unsteadiness with walking that he has never had before.  He attempted to present to work this morning when he became acutely worse, severely dizzy, unable to walk on his own, and began to have nausea and vomiting so came to ED for further evaluation.  He denies vision changes, fever, chills, chest pain, shortness of breath, headache, focal weakness.  He denies personal or family history of heart disease or CVA/TIA.  Wife states that she is not in pain and with the dizziness she looked in his ears this morning and thought that they were both erythematous so she put some leftover Cipro drops in both ears before bringing patient to the ED.  Patient denies ear pain or tinnitus or hearing changes.  Review of Systems  Positive: See HPI Negative: See HPI  Physical Exam  BP (!) 153/97   Pulse 90   Temp 98.3 F (36.8 C) (Oral)   Resp 14   SpO2 93%  Gen:   Awake, no distress   Resp:  Normal effort lungs clear to auscultation MSK:   Moves extremities without difficulty  Other:  Alert, answering questions appropriately, 5/5 strength in bilateral upper and lower extremities, normal speech, pupils equal round and reactive to light, no nystagmus, regular rate and rhythm, cranial nerves intact, abdomen soft nontender  Medical Decision Making  Medically screening exam initiated at 2:09 PM.  Appropriate orders placed.  Pat Homeyer was informed that the remainder of the evaluation will be completed by another provider, this initial triage assessment does not replace that evaluation, and the importance of remaining in the ED until their evaluation is complete.     Suzzette Righter, PA-C 10/07/22 1411

## 2022-10-07 NOTE — ED Notes (Signed)
Pt to MRI

## 2022-10-07 NOTE — ED Triage Notes (Signed)
Pt arrived POV from home stating that since Saturday he has been dizzy and off balance, pt attempted to go to work this morning but was unable to drive himself so his wife took him. Pt then became nauseous and started vomiting at work. Pt's wife states she looked into his ears and they were red so she put antibiotic drops in his ears.

## 2022-10-11 ENCOUNTER — Telehealth: Payer: Self-pay | Admitting: General Practice

## 2022-10-11 NOTE — Telephone Encounter (Signed)
Transition Care Management Follow-up Telephone Call Date of discharge and from where: 10/07/22 from [facility] How have you been since you were released from the hospital? Patient is doing much better. Still have some hearing loss in left ear otherwise doing better. He is scheduled to see the ENT in March. Any questions or concerns? No  Items Reviewed: Did the pt receive and understand the discharge instructions provided? Yes  Medications obtained and verified? Yes  Other? No  Any new allergies since your discharge? No  Dietary orders reviewed? Yes Do you have support at home? Yes   Home Care and Equipment/Supplies: Were home health services ordered? no   Functional Questionnaire: (I = Independent and D = Dependent) ADLs: I  Bathing/Dressing- I  Meal Prep- I  Eating- I  Maintaining continence- I  Transferring/Ambulation- I  Managing Meds- I  Follow up appointments reviewed:  PCP Hospital f/u appt confirmed? No. Patient will call back to schedule. [facility] f/u appt confirmed? No   Are transportation arrangements needed? No  If their condition worsens, is the pt aware to call PCP or go to the Emergency Dept.? Yes Was the patient provided with contact information for the PCP's office or ED? Yes Was to pt encouraged to call back with questions or concerns? Yes

## 2022-10-12 ENCOUNTER — Other Ambulatory Visit (HOSPITAL_COMMUNITY): Payer: Self-pay

## 2022-11-13 ENCOUNTER — Other Ambulatory Visit (HOSPITAL_COMMUNITY): Payer: Self-pay

## 2022-12-16 ENCOUNTER — Other Ambulatory Visit (HOSPITAL_COMMUNITY): Payer: Self-pay

## 2022-12-17 ENCOUNTER — Other Ambulatory Visit: Payer: Self-pay

## 2023-01-15 ENCOUNTER — Other Ambulatory Visit (HOSPITAL_COMMUNITY): Payer: Self-pay

## 2023-01-23 ENCOUNTER — Other Ambulatory Visit (HOSPITAL_COMMUNITY): Payer: Self-pay

## 2023-01-23 MED ORDER — PREDNISONE 20 MG PO TABS
20.0000 mg | ORAL_TABLET | Freq: Three times a day (TID) | ORAL | 0 refills | Status: DC
  Filled 2023-01-23: qty 49, 17d supply, fill #0

## 2023-01-24 ENCOUNTER — Ambulatory Visit: Payer: 59 | Admitting: Medical-Surgical

## 2023-01-24 ENCOUNTER — Other Ambulatory Visit (HOSPITAL_COMMUNITY): Payer: Self-pay

## 2023-02-10 ENCOUNTER — Other Ambulatory Visit (HOSPITAL_COMMUNITY): Payer: Self-pay

## 2023-02-10 MED ORDER — PREDNISONE 20 MG PO TABS
20.0000 mg | ORAL_TABLET | Freq: Three times a day (TID) | ORAL | 0 refills | Status: DC
  Filled 2023-02-10: qty 50, 17d supply, fill #0

## 2023-02-12 ENCOUNTER — Other Ambulatory Visit (HOSPITAL_COMMUNITY): Payer: Self-pay

## 2023-03-01 LAB — HM DIABETES EYE EXAM

## 2023-03-14 ENCOUNTER — Other Ambulatory Visit (HOSPITAL_COMMUNITY): Payer: Self-pay

## 2023-03-16 ENCOUNTER — Other Ambulatory Visit: Payer: Self-pay | Admitting: Medical-Surgical

## 2023-03-16 DIAGNOSIS — E1165 Type 2 diabetes mellitus with hyperglycemia: Secondary | ICD-10-CM

## 2023-03-17 ENCOUNTER — Other Ambulatory Visit: Payer: Self-pay

## 2023-03-17 ENCOUNTER — Encounter: Payer: Self-pay | Admitting: Medical-Surgical

## 2023-03-17 ENCOUNTER — Other Ambulatory Visit (HOSPITAL_COMMUNITY): Payer: Self-pay

## 2023-03-17 MED ORDER — EMPAGLIFLOZIN 25 MG PO TABS
25.0000 mg | ORAL_TABLET | Freq: Every day | ORAL | 0 refills | Status: DC
  Filled 2023-03-17: qty 30, 30d supply, fill #0
  Filled 2023-04-29: qty 30, 30d supply, fill #1

## 2023-03-17 MED ORDER — LOSARTAN POTASSIUM 50 MG PO TABS
50.0000 mg | ORAL_TABLET | Freq: Every day | ORAL | 0 refills | Status: DC
  Filled 2023-03-17: qty 30, 30d supply, fill #0
  Filled 2023-04-29: qty 30, 30d supply, fill #1

## 2023-03-19 ENCOUNTER — Other Ambulatory Visit (HOSPITAL_COMMUNITY): Payer: Self-pay

## 2023-03-21 ENCOUNTER — Ambulatory Visit: Payer: 59 | Admitting: Medical-Surgical

## 2023-04-29 ENCOUNTER — Other Ambulatory Visit (HOSPITAL_COMMUNITY): Payer: Self-pay

## 2023-05-02 ENCOUNTER — Encounter: Payer: Self-pay | Admitting: Medical-Surgical

## 2023-05-02 ENCOUNTER — Ambulatory Visit: Payer: 59 | Admitting: Medical-Surgical

## 2023-05-02 ENCOUNTER — Other Ambulatory Visit (HOSPITAL_COMMUNITY): Payer: Self-pay

## 2023-05-02 VITALS — BP 119/73 | HR 92 | Resp 20 | Ht 66.0 in | Wt 160.0 lb

## 2023-05-02 DIAGNOSIS — I10 Essential (primary) hypertension: Secondary | ICD-10-CM | POA: Diagnosis not present

## 2023-05-02 DIAGNOSIS — E1165 Type 2 diabetes mellitus with hyperglycemia: Secondary | ICD-10-CM | POA: Diagnosis not present

## 2023-05-02 DIAGNOSIS — E785 Hyperlipidemia, unspecified: Secondary | ICD-10-CM

## 2023-05-02 DIAGNOSIS — Z7984 Long term (current) use of oral hypoglycemic drugs: Secondary | ICD-10-CM

## 2023-05-02 DIAGNOSIS — E1169 Type 2 diabetes mellitus with other specified complication: Secondary | ICD-10-CM | POA: Diagnosis not present

## 2023-05-02 DIAGNOSIS — Z23 Encounter for immunization: Secondary | ICD-10-CM

## 2023-05-02 LAB — POCT GLYCOSYLATED HEMOGLOBIN (HGB A1C)
HbA1c, POC (controlled diabetic range): 7.9 % — AB (ref 0.0–7.0)
Hemoglobin A1C: 7.9 % — AB (ref 4.0–5.6)

## 2023-05-02 LAB — POCT UA - MICROALBUMIN
Albumin/Creatinine Ratio, Urine, POC: >300
Creatinine, POC: 50 mg/dL
Microalbumin Ur, POC: 150 mg/L

## 2023-05-02 MED ORDER — FREESTYLE LIBRE 3 SENSOR MISC
5 refills | Status: DC
  Filled 2023-05-02: qty 2, 28d supply, fill #0
  Filled 2023-05-19: qty 2, 28d supply, fill #1
  Filled 2023-07-06: qty 2, 28d supply, fill #2
  Filled 2023-07-07: qty 4, 56d supply, fill #2
  Filled 2023-09-04 (×2): qty 2, 28d supply, fill #3
  Filled 2023-10-02: qty 2, 28d supply, fill #4

## 2023-05-02 MED ORDER — METFORMIN HCL ER 500 MG PO TB24
1000.0000 mg | ORAL_TABLET | Freq: Two times a day (BID) | ORAL | 1 refills | Status: DC
  Filled 2023-05-02: qty 120, 30d supply, fill #0
  Filled 2023-08-04: qty 120, 30d supply, fill #1
  Filled 2023-09-04: qty 120, 30d supply, fill #2
  Filled 2023-10-02 – 2023-10-06 (×3): qty 120, 30d supply, fill #3

## 2023-05-02 MED ORDER — LOSARTAN POTASSIUM 50 MG PO TABS
50.0000 mg | ORAL_TABLET | Freq: Every day | ORAL | 1 refills | Status: DC
  Filled 2023-05-02: qty 90, 90d supply, fill #0
  Filled 2023-05-27: qty 30, 30d supply, fill #0
  Filled 2023-06-25: qty 30, 30d supply, fill #1
  Filled 2023-08-04: qty 30, 30d supply, fill #2
  Filled 2023-09-04: qty 30, 30d supply, fill #3
  Filled 2023-10-02: qty 60, 60d supply, fill #4
  Filled 2023-10-06 (×2): qty 30, 30d supply, fill #4

## 2023-05-02 MED ORDER — EMPAGLIFLOZIN 25 MG PO TABS
25.0000 mg | ORAL_TABLET | Freq: Every day | ORAL | 1 refills | Status: DC
  Filled 2023-05-02: qty 90, 90d supply, fill #0
  Filled 2023-05-27: qty 30, 30d supply, fill #0
  Filled 2023-06-25: qty 30, 30d supply, fill #1
  Filled 2023-08-04: qty 30, 30d supply, fill #2
  Filled 2023-09-04: qty 30, 30d supply, fill #3
  Filled 2023-10-02: qty 30, 30d supply, fill #4
  Filled 2023-10-06: qty 30, 30d supply, fill #5

## 2023-05-02 MED ORDER — SIMVASTATIN 40 MG PO TABS
40.0000 mg | ORAL_TABLET | Freq: Every day | ORAL | 3 refills | Status: DC
  Filled 2023-05-02: qty 90, 90d supply, fill #0
  Filled 2023-05-27: qty 30, 30d supply, fill #0
  Filled 2023-06-25: qty 30, 30d supply, fill #1
  Filled 2023-08-04: qty 30, 30d supply, fill #2
  Filled 2023-09-04: qty 30, 30d supply, fill #3
  Filled 2023-10-02 – 2023-10-06 (×3): qty 30, 30d supply, fill #4

## 2023-05-02 NOTE — Progress Notes (Unsigned)
        Established patient visit  History, exam, impression, and plan:  1. Type 2 diabetes mellitus with hyperglycemia, without long-term current use of insulin [facility]) Pleasant 57 year old male presenting today with a history of type 2 diabetes.  Last hemoglobin A1c checked August 28, 2022 with result of 8.9%.  He was lost to follow-up thereafter but returns today to get back on track.  Currently taking metformin 1000 mg twice daily, Jardiance 25 mg daily, and Januvia 100 mg daily.  Tolerating all medications well without side effects.  Not regularly checking blood sugars at home although he does occasionally do so.  Requesting a prescription for freestyle libre 3 to aid with closer management of sugars.  POCT A1c rechecked with result of 7.9% today.  Sugars still not well-controlled.  POCT microalbumin high abnormal similar to previous readings.  Plan for working on tighter glucose control to aid with microalbuminuria.  Freestyle libre 3 sensor sample provided in office today.  Discussed that insurance will likely not cover the sensors although there is a coupon he can get online that will help reduce the cost to $75 per month for these.  He is amenable to paying out-of-pocket so sending prescription in today.  Foot exam declined today but reports he will do that next time.  Encouraged working on diabetic diet modifications and increasing regular intentional exercise. - POCT UA - Microalbumin - POCT HgB A1C - empagliflozin (JARDIANCE) 25 MG TABS tablet; Take 1 tablet (25 mg total) by mouth daily.  Dispense: 90 tablet; Refill: 1 - metFORMIN (GLUCOPHAGE-XR) 500 MG 24 hr tablet; Take 2 tablets (1,000 mg total) by mouth 2 (two) times daily.  Dispense: 360 tablet; Refill: 1  2. Hyperlipidemia associated with type 2 diabetes mellitus (HCC) History of hyperlipidemia associated with diabetes.  He is currently taking Zocor 40 mg daily, tolerating well without side effects.  Checking labs as below.  Continue  Zocor as prescribed. - simvastatin (ZOCOR) 40 MG tablet; Take 1 tablet (40 mg total) by mouth daily.  Dispense: 90 tablet; Refill: 3 - CMP14+EGFR - Lipid panel  3. Essential hypertension Currently taking losartan 50 mg daily, tolerating well without side effects.  Not regularly checking blood pressure at home.  Following low-sodium diet.  Denies concerning symptoms.  Cardiopulmonary exam normal today.  Blood pressure at goal.  Continue losartan 50 mg daily as prescribed. - CBC  4. Need for influenza vaccination Flu vaccine given in office today. - Flu vaccine trivalent PF, 6mos and older(Flulaval,Afluria,Fluarix,Fluzone)   Procedures performed this visit: None.  Return in about 3 months (around 08/02/2023) for DM/HTN/HLD follow up.  __________________________________ Thayer Ohm, DNP, APRN, FNP-BC Primary Care and Sports Medicine [facility]

## 2023-05-03 LAB — CMP14+EGFR
ALT: 29 IU/L (ref 0–44)
AST: 26 IU/L (ref 0–40)
Albumin: 4.4 g/dL (ref 3.8–4.9)
Alkaline Phosphatase: 91 IU/L (ref 44–121)
BUN/Creatinine Ratio: 17 (ref 9–20)
BUN: 17 mg/dL (ref 6–24)
Bilirubin Total: 0.4 mg/dL (ref 0.0–1.2)
CO2: 18 mmol/L — ABNORMAL LOW (ref 20–29)
Calcium: 10.2 mg/dL (ref 8.7–10.2)
Chloride: 98 mmol/L (ref 96–106)
Creatinine, Ser: 1.01 mg/dL (ref 0.76–1.27)
Globulin, Total: 2.7 g/dL (ref 1.5–4.5)
Glucose: 272 mg/dL — ABNORMAL HIGH (ref 70–99)
Potassium: 3.8 mmol/L (ref 3.5–5.2)
Sodium: 137 mmol/L (ref 134–144)
Total Protein: 7.1 g/dL (ref 6.0–8.5)
eGFR: 87 mL/min/{1.73_m2} (ref >59)

## 2023-05-03 LAB — CBC
Hematocrit: 47.6 % (ref 37.5–51.0)
Hemoglobin: 15.4 g/dL (ref 13.0–17.7)
MCH: 30.8 pg (ref 26.6–33.0)
MCHC: 32.4 g/dL (ref 31.5–35.7)
MCV: 95 fL (ref 79–97)
Platelets: 354 10*3/uL (ref 150–450)
RBC: 5 x10E6/uL (ref 4.14–5.80)
RDW: 11.8 % (ref 11.6–15.4)
WBC: 7.4 10*3/uL (ref 3.4–10.8)

## 2023-05-03 LAB — LIPID PANEL
Chol/HDL Ratio: 7.8 ratio — ABNORMAL HIGH (ref 0.0–5.0)
Cholesterol, Total: 194 mg/dL (ref 100–199)
HDL: 25 mg/dL — ABNORMAL LOW (ref >39)
LDL Chol Calc (NIH): 68 mg/dL (ref 0–99)
Triglycerides: 654 mg/dL (ref 0–149)
VLDL Cholesterol Cal: 101 mg/dL — ABNORMAL HIGH (ref 5–40)

## 2023-05-09 MED ORDER — ICOSAPENT ETHYL 1 G PO CAPS
2.0000 g | ORAL_CAPSULE | Freq: Two times a day (BID) | ORAL | 3 refills | Status: AC
  Filled 2023-05-09: qty 120, 30d supply, fill #0

## 2023-05-09 NOTE — Addendum Note (Signed)
Addended byChristen Butter on: 05/09/2023 06:30 PM   Modules accepted: Orders

## 2023-05-12 ENCOUNTER — Other Ambulatory Visit (HOSPITAL_COMMUNITY): Payer: Self-pay

## 2023-05-19 ENCOUNTER — Other Ambulatory Visit (HOSPITAL_COMMUNITY): Payer: Self-pay

## 2023-05-27 ENCOUNTER — Other Ambulatory Visit (HOSPITAL_COMMUNITY): Payer: Self-pay

## 2023-05-29 ENCOUNTER — Other Ambulatory Visit (HOSPITAL_COMMUNITY): Payer: Self-pay

## 2023-06-03 ENCOUNTER — Other Ambulatory Visit (HOSPITAL_COMMUNITY): Payer: Self-pay

## 2023-06-09 ENCOUNTER — Other Ambulatory Visit (HOSPITAL_COMMUNITY): Payer: Self-pay

## 2023-06-11 ENCOUNTER — Other Ambulatory Visit (HOSPITAL_COMMUNITY): Payer: Self-pay

## 2023-06-25 ENCOUNTER — Other Ambulatory Visit (HOSPITAL_COMMUNITY): Payer: Self-pay

## 2023-07-06 ENCOUNTER — Other Ambulatory Visit (HOSPITAL_COMMUNITY): Payer: Self-pay

## 2023-07-07 ENCOUNTER — Other Ambulatory Visit (HOSPITAL_COMMUNITY): Payer: Self-pay

## 2023-08-04 ENCOUNTER — Other Ambulatory Visit (HOSPITAL_COMMUNITY): Payer: Self-pay

## 2023-08-15 ENCOUNTER — Telehealth: Payer: Self-pay

## 2023-08-15 NOTE — Telephone Encounter (Signed)
Copied from CRM [id number]. Topic: Appointments - Appointment Cancel/Reschedule >> Aug 13, 2023  9:47 AM Johnathan Warner H wrote: Patient/patient representative is calling to cancel or reschedule an appointment. Refer to attachments for appointment information. Patient is scheduling more than 3 moths out please follow up with patient

## 2023-08-15 NOTE — Telephone Encounter (Signed)
I called and left message for patient to come in sooner. He needed to come in November. March to too late.

## 2023-08-18 ENCOUNTER — Ambulatory Visit: Payer: 59 | Admitting: Medical-Surgical

## 2023-09-04 ENCOUNTER — Other Ambulatory Visit (HOSPITAL_COMMUNITY): Payer: Self-pay

## 2023-09-05 ENCOUNTER — Other Ambulatory Visit (HOSPITAL_COMMUNITY): Payer: Self-pay

## 2023-10-02 ENCOUNTER — Other Ambulatory Visit: Payer: Self-pay

## 2023-10-02 ENCOUNTER — Other Ambulatory Visit (HOSPITAL_COMMUNITY): Payer: Self-pay

## 2023-10-06 ENCOUNTER — Ambulatory Visit: Payer: 59 | Admitting: Medical-Surgical

## 2023-10-06 ENCOUNTER — Encounter: Payer: Self-pay | Admitting: Medical-Surgical

## 2023-10-06 ENCOUNTER — Other Ambulatory Visit (HOSPITAL_COMMUNITY): Payer: Self-pay

## 2023-10-06 VITALS — BP 131/81 | HR 81 | Resp 20 | Ht 66.0 in | Wt 161.9 lb

## 2023-10-06 DIAGNOSIS — E785 Hyperlipidemia, unspecified: Secondary | ICD-10-CM

## 2023-10-06 DIAGNOSIS — Z7984 Long term (current) use of oral hypoglycemic drugs: Secondary | ICD-10-CM

## 2023-10-06 DIAGNOSIS — H1132 Conjunctival hemorrhage, left eye: Secondary | ICD-10-CM | POA: Diagnosis not present

## 2023-10-06 DIAGNOSIS — I10 Essential (primary) hypertension: Secondary | ICD-10-CM

## 2023-10-06 DIAGNOSIS — E1169 Type 2 diabetes mellitus with other specified complication: Secondary | ICD-10-CM

## 2023-10-06 DIAGNOSIS — E1165 Type 2 diabetes mellitus with hyperglycemia: Secondary | ICD-10-CM | POA: Diagnosis not present

## 2023-10-06 LAB — POCT GLYCOSYLATED HEMOGLOBIN (HGB A1C)
HbA1c, POC (controlled diabetic range): 7.8 % — AB (ref 0.0–7.0)
Hemoglobin A1C: 7.8 % — AB (ref 4.0–5.6)

## 2023-10-06 LAB — HM DIABETES EYE EXAM

## 2023-10-06 MED ORDER — SITAGLIPTIN PHOSPHATE 100 MG PO TABS
100.0000 mg | ORAL_TABLET | Freq: Every day | ORAL | 1 refills | Status: AC
  Filled 2023-10-06: qty 90, 90d supply, fill #0

## 2023-10-06 MED ORDER — POLYMYXIN B-TRIMETHOPRIM 10000-0.1 UNIT/ML-% OP SOLN
1.0000 [drp] | Freq: Four times a day (QID) | OPHTHALMIC | 0 refills | Status: AC
  Filled 2023-10-06: qty 10, 30d supply, fill #0

## 2023-10-06 MED ORDER — SIMVASTATIN 40 MG PO TABS
40.0000 mg | ORAL_TABLET | Freq: Every day | ORAL | 1 refills | Status: DC
  Filled 2023-10-06: qty 30, 30d supply, fill #0
  Filled 2023-11-03: qty 30, 30d supply, fill #1
  Filled 2023-12-01: qty 30, 30d supply, fill #2
  Filled 2023-12-29 (×2): qty 30, 30d supply, fill #3
  Filled 2024-01-29: qty 30, 30d supply, fill #4
  Filled 2024-02-23: qty 30, 30d supply, fill #5

## 2023-10-06 MED ORDER — METFORMIN HCL ER 500 MG PO TB24
1000.0000 mg | ORAL_TABLET | Freq: Two times a day (BID) | ORAL | 1 refills | Status: DC
  Filled 2023-10-06: qty 120, 30d supply, fill #0
  Filled 2023-11-03: qty 120, 30d supply, fill #1
  Filled 2023-12-01: qty 120, 30d supply, fill #2
  Filled 2023-12-29 (×2): qty 120, 30d supply, fill #3
  Filled 2024-01-29: qty 120, 30d supply, fill #4
  Filled 2024-02-23: qty 120, 30d supply, fill #5

## 2023-10-06 MED ORDER — EMPAGLIFLOZIN 25 MG PO TABS
25.0000 mg | ORAL_TABLET | Freq: Every day | ORAL | 1 refills | Status: DC
  Filled 2023-10-06: qty 90, 90d supply, fill #0
  Filled 2023-11-03: qty 30, 30d supply, fill #0
  Filled 2023-12-01: qty 30, 30d supply, fill #1
  Filled 2023-12-29 (×2): qty 30, 30d supply, fill #2
  Filled 2024-01-29: qty 30, 30d supply, fill #3
  Filled 2024-02-23: qty 30, 30d supply, fill #4
  Filled 2024-03-31: qty 30, 30d supply, fill #5

## 2023-10-06 MED ORDER — LOSARTAN POTASSIUM 50 MG PO TABS
50.0000 mg | ORAL_TABLET | Freq: Every day | ORAL | 1 refills | Status: DC
  Filled 2023-10-06 (×2): qty 30, 30d supply, fill #0
  Filled 2023-11-03: qty 30, 30d supply, fill #1
  Filled 2023-12-01: qty 30, 30d supply, fill #2
  Filled 2023-12-29 (×2): qty 30, 30d supply, fill #3
  Filled 2024-01-29: qty 30, 30d supply, fill #4
  Filled 2024-02-23: qty 30, 30d supply, fill #5

## 2023-10-06 MED ORDER — FREESTYLE LIBRE 3 SENSOR MISC
5 refills | Status: DC
  Filled 2023-10-06: qty 2, fill #0
  Filled 2023-11-03: qty 2, 28d supply, fill #0
  Filled 2023-12-01: qty 2, 28d supply, fill #1
  Filled 2023-12-29 (×3): qty 2, 28d supply, fill #2
  Filled 2024-01-29: qty 2, 28d supply, fill #3
  Filled 2024-02-23 (×2): qty 2, 28d supply, fill #4
  Filled 2024-03-31: qty 2, 28d supply, fill #5

## 2023-10-06 NOTE — Progress Notes (Signed)
 Medical screening examination/treatment was performed by qualified clinical staff member and as supervising provider I was immediately available for consultation/collaboration. I have reviewed documentation and agree with assessment and plan.  Thayer Ohm, DNP, APRN, FNP-BC [facility]

## 2023-10-06 NOTE — Progress Notes (Signed)
Subjective:  Patient ID: Johnathan Warner, male    DOB: [birth year], 58 y.o.   MRN: [id number]  Patient Care Team: Christen Butter, NP as PCP - General (Nurse Practitioner)   Chief Complaint:  Conjunctivitis (LEFT )   HPI:  Johnathan Warner is a 58 y.o. male presenting on 10/06/2023 for Conjunctivitis (LEFT )   History, Exam,  Impression and Plan Diagnoses and all orders for this visit:  Subconjunctival hemorrhage of left eye Started about a week ago. No pupillary or iris inolvement. Pt was unable to get in to see his eye doctor and made appointment here. Denies trauma or any heavy lifting. Denies any vision changes or any pain. Focal point for subcojunctival hemorrhage below iris and is approx 0.5 cm in diameter with scattered hemorrhage throughout conjunctiva. PERRLA. No sensitivity to light. Conjunctiva is smooth and with out physical breaks or ulcerations. No edema or erythema to eyelids or orbit.  Will refer to Ophthalmology for followup . Pt knows to seek medical attention if doesn't improve in next week or two  -     Ambulatory referral to Ophthalmology -     trimethoprim-polymyxin b (POLYTRIM) ophthalmic solution; Place 1 drop into both eyes 4 (four) times         daily for 7 days  Essential hypertension Currently taking losartan 50 mg daily, tolerating well without side effects, No chest pain, palpitations, or lower extremity swelling. Not regularly checking blood pressure at home. Following low-sodium diet. Denies any other concerning symptoms.  -     Continue and refill losartan (COZAAR) 50 MG tablet; Take 1 tablet (50 mg total) by mouth daily.    10/06/2023   11:23 AM 05/02/2023    2:16 PM 10/07/2022    9:20 PM  Vitals with BMI  Height 5\' 6"  5\' 6"    Weight 161 lbs 14 oz 160 lbs 1 oz   BMI 26.14 25.84   Systolic 131 119 409  Diastolic 81 73 88  Pulse 81 92 98     Type 2 diabetes mellitus with hyperglycemia, without long-term current use of insulin (HCC) Pt was scheduled for  November 2024 but appointment cancelled. A1c 7.8 today. Currently taking metformin 1000 mg twice daily, Jardiance 25 mg daily, and Januvia 100 mg daily. Patient is not interested in changing diabetic regimen this time but does acknowledge needing to improve diet and exercise. Freestyle libre 3 sensor in use and patient has linked to smart phone.       -     Continue and refill empagliflozin (JARDIANCE) 25 MG TABS tablet; Take 1 tablet (25 mg total) by               mouth daily. -     Continue and refill metFORMIN (GLUCOPHAGE-XR) 500 MG 24 hr tablet; Take 2 tablets (1,000 mg         total) by mouth 2 (two) times daily. -     POCT HgB A1C today -     Continue and refill Continuous Glucose Sensor (FREESTYLE LIBRE 3 SENSOR) MISC; Place 1         sensor on the skin every        14 days. Use to check glucose continuously -     Continue and refill sitaGLIPtin (JANUVIA) 100 MG tablet; Take 1 tablet (100 mg total) by mouth daily.  Hyperlipidemia associated with type 2 diabetes mellitus (HCC) Patient is taking simvastatin daily and denies any side effects and denies myalgia.  Pt  not interested in changing or addressing labs at this time but does acknowledge needing to improve diet and exercise. -     Continue and refill simvastatin (ZOCOR) 40 MG tablet; Take 1 tablet (40 mg total) by mouth daily.  Continue all other maintenance medications.  Follow up plan: Return in about 3 months (around 01/03/2024) for DM follow up and possible addition of Semaglutide PO   Relevant past medical, surgical, family, and social history reviewed and updated as indicated.  Allergies and medications reviewed and updated. Data reviewed: Chart in Epic.   Past Medical History:  Diagnosis Date   Diabetes mellitus without complication (HCC)    Hyperlipemia    Hypertension     No past surgical history on file.  Social History   Socioeconomic History   Marital status: Married    Spouse name: Not on file   Number of  children: Not on file   Years of education: Not on file   Highest education level: Not on file  Occupational History   Not on file  Tobacco Use   Smoking status: Former    Current packs/day: 0.00    Types: Cigarettes    Quit date: 88    Years since quitting: 31.1   Smokeless tobacco: Never  Vaping Use   Vaping status: Never Used  Substance and Sexual Activity   Alcohol use: No   Drug use: Not Currently   Sexual activity: Not on file  Other Topics Concern   Not on file  Social History Narrative   Not on file   Social Drivers of Health   Financial Resource Strain: Not on file  Food Insecurity: Low Risk  (01/02/2023)   [facility]   Hunger Vital Sign    Worried About Running Out of Food in the Last Year: Never true    Ran Out of Food in the Last Year: Never true  Transportation Needs: No Transportation Needs (01/02/2023)   [facility]   Transportation    In the past 12 months, has lack of reliable transportation kept you from medical appointments, meetings, work or from getting things needed for daily living? : No  Physical Activity: Not on file  Stress: Not on file  Social Connections: Not on file  Intimate Partner Violence: Not on file    Outpatient Encounter Medications as of 10/06/2023  Medication Sig   acetaminophen (TYLENOL) 500 MG tablet Take 500 mg by mouth every 6 (six) hours as needed.   aspirin EC 81 MG tablet Take 1 tablet (81 mg total) by mouth daily.   betamethasone dipropionate 0.05 % cream Apply topically 2 (two) times daily to affected eczema area(s) as needed   Blood Glucose Monitoring Suppl (FREESTYLE LITE) w/Device KIT Use as directed   glucose blood test strip Use up to 4 (four) times daily to check blood sugar   guaiFENesin (ROBITUSSIN) 100 MG/5ML liquid Take 5 mLs by mouth every 4 (four) hours as needed for cough or to loosen phlegm.   icosapent Ethyl (VASCEPA) 1 g capsule Take 2 capsules (2  g total) by mouth 2 (two) times daily.   Lancets (FREESTYLE) lancets Check glucose up to 4 (four) times daily.   meclizine (ANTIVERT) 25 MG tablet Take 1 tablet (25 mg total) by mouth 3 (three) times daily as needed for dizziness.   meloxicam (MOBIC) 15 MG tablet take 1 tablet, by mouth, every 24 hours with a meal for 2  weeks, then once every 24 hours if needed for pain.   triamcinolone cream (KENALOG) 0.1 % Apply 1 application topically 2 (two) times daily.   [DISCONTINUED] Continuous Glucose Sensor (FREESTYLE LIBRE 3 SENSOR) MISC Place 1 sensor on the skin every 14 days. Use to check glucose continuously   [DISCONTINUED] empagliflozin (JARDIANCE) 25 MG TABS tablet Take 1 tablet (25 mg total) by mouth daily.   [DISCONTINUED] losartan (COZAAR) 50 MG tablet Take 1 tablet (50 mg total) by mouth daily.   [DISCONTINUED] metFORMIN (GLUCOPHAGE-XR) 500 MG 24 hr tablet Take 2 tablets (1,000 mg total) by mouth 2 (two) times daily.   [DISCONTINUED] simvastatin (ZOCOR) 40 MG tablet Take 1 tablet (40 mg total) by mouth daily.   [DISCONTINUED] sitaGLIPtin (JANUVIA) 100 MG tablet Take 1 tablet (100 mg total) by mouth daily.   [DISCONTINUED] trimethoprim-polymyxin b (POLYTRIM) ophthalmic solution Place 1 drop into both eyes 4 (four) times daily for 7 days   Continuous Glucose Sensor (FREESTYLE LIBRE 3 SENSOR) MISC Place 1 sensor on the skin every 14 days. Use to check glucose continuously   empagliflozin (JARDIANCE) 25 MG TABS tablet Take 1 tablet (25 mg total) by mouth daily.   fexofenadine (ALLEGRA ALLERGY) 180 MG tablet Take 1 tablet (180 mg total) by mouth daily.   losartan (COZAAR) 50 MG tablet Take 1 tablet (50 mg total) by mouth daily.   metFORMIN (GLUCOPHAGE-XR) 500 MG 24 hr tablet Take 2 tablets (1,000 mg total) by mouth 2 (two) times daily.   sildenafil (VIAGRA) 100 MG tablet Take 0.5-1 tablets (50-100 mg total) by mouth as needed for erectile dysfunction.   simvastatin (ZOCOR) 40 MG tablet Take 1  tablet (40 mg total) by mouth daily.   sitaGLIPtin (JANUVIA) 100 MG tablet Take 1 tablet (100 mg total) by mouth daily.   trimethoprim-polymyxin b (POLYTRIM) ophthalmic solution Place 1 drop into both eyes 4 (four) times daily for 7 days   No facility-administered encounter medications on file as of 10/06/2023.    Allergies  Allergen Reactions   Sulfa Antibiotics Other (See Comments)    Hot flashes photodermatitis    Amlodipine Other (See Comments)    As per pt - rash on face, hot flashes and photodermatitis.    Atorvastatin Other (See Comments)   Celecoxib Other (See Comments)    photodermatitis   Hydrochlorothiazide Other (See Comments)    photodermatitis   Lisinopril Other (See Comments)    Cough   Simvastatin Other (See Comments)    Inc LFT's Pt states he is not allergic to this--he is currently taking   Sulfamethoxazole     photodermatitis   Sulfonamide Derivatives    Tetanus Toxoid, Adsorbed Other (See Comments)    unknown     Review of Systems      Objective:  BP 131/81 (BP Location: Right Arm, Cuff Size: Normal)   Pulse 81   Resp 20   Ht 5\' 6"  (1.676 m)   Wt 161 lb 14.4 oz (73.4 kg)   SpO2 96%   BMI 26.13 kg/m    Wt Readings from Last 3 Encounters:  10/06/23 161 lb 14.4 oz (73.4 kg)  05/02/23 160 lb 0.6 oz (72.6 kg)  08/28/22 165 lb 6.4 oz (75 kg)    Physical Exam  Results for orders placed or performed in visit on 10/06/23  POCT HgB A1C   Collection Time: 10/06/23 11:51 AM  Result Value Ref Range   Hemoglobin A1C 7.8 (A) 4.0 - 5.6 %   HbA1c  POC (<> result, manual entry)     HbA1c, POC (prediabetic range)     HbA1c, POC (controlled diabetic range) 7.8 (A) 0.0 - 7.0 %       Pertinent labs & imaging results that were available during my care of the patient were reviewed by me and considered in my medical decision making.   Continue healthy lifestyle choices, including diet (rich in fruits, vegetables, and lean proteins, and low in salt and  simple carbohydrates) and exercise (at least 30 minutes of moderate physical activity daily).   The above assessment and management plan was discussed with the patient. The patient verbalized understanding of and has agreed to the management plan. Patient is aware to call the clinic if they develop any new symptoms or if symptoms persist or worsen. Patient is aware when to return to the clinic for a follow-up visit. Patient educated on when it is appropriate to go to the emergency department.   Maryelizabeth Kaufmann Student AGNP

## 2023-10-10 ENCOUNTER — Ambulatory Visit: Payer: 59 | Admitting: Medical-Surgical

## 2023-10-15 ENCOUNTER — Other Ambulatory Visit (HOSPITAL_COMMUNITY): Payer: Self-pay

## 2023-11-03 ENCOUNTER — Other Ambulatory Visit (HOSPITAL_COMMUNITY): Payer: Self-pay

## 2023-11-07 ENCOUNTER — Ambulatory Visit: Payer: 59 | Admitting: Medical-Surgical

## 2023-11-14 ENCOUNTER — Encounter: Payer: 59 | Admitting: Medical-Surgical

## 2023-11-14 NOTE — Progress Notes (Deleted)
 Complete physical exam  Patient: Johnathan Warner   DOB: 07-10-1966   58 y.o. Male  MRN: [id number]  Subjective:    No chief complaint on file.   Jatavis Vallee is a 58 y.o. male who presents today for a complete physical exam. He reports consuming a {diet types:17450} diet. {types:19826} He generally feels {DESC; WELL/FAIRLY WELL/POORLY:18703}. He reports sleeping {DESC; WELL/FAIRLY WELL/POORLY:18703}. He {does/does not:200015} have additional problems to discuss today.    Most recent fall risk assessment:    10/06/2023   11:26 AM  Fall Risk   Falls in the past year? 0  Number falls in past yr: 0  Injury with Fall? 0  Risk for fall due to : No Fall Risks  Follow up Falls evaluation completed     Most recent depression screenings:    10/06/2023   11:25 AM 08/28/2022   10:34 AM  PHQ 2/9 Scores  PHQ - 2 Score 0 0    {VISON DENTAL STD PSA (Optional):27386}  {History (Optional):23778}  Patient Care Team: Christen Butter, NP as PCP - General (Nurse Practitioner)   Outpatient Medications Prior to Visit  Medication Sig   acetaminophen (TYLENOL) 500 MG tablet Take 500 mg by mouth every 6 (six) hours as needed.   aspirin EC 81 MG tablet Take 1 tablet (81 mg total) by mouth daily.   betamethasone dipropionate 0.05 % cream Apply topically 2 (two) times daily to affected eczema area(s) as needed   Blood Glucose Monitoring Suppl (FREESTYLE LITE) w/Device KIT Use as directed   Continuous Glucose Sensor (FREESTYLE LIBRE 3 SENSOR) MISC Place 1 sensor on the skin every 14 days. Use to check glucose continuously   empagliflozin (JARDIANCE) 25 MG TABS tablet Take 1 tablet (25 mg total) by mouth daily.   fexofenadine (ALLEGRA ALLERGY) 180 MG tablet Take 1 tablet (180 mg total) by mouth daily.   glucose blood test strip Use up to 4 (four) times daily to check blood sugar   guaiFENesin (ROBITUSSIN) 100 MG/5ML liquid Take 5 mLs by mouth every 4 (four) hours as needed for cough or to loosen phlegm.    icosapent Ethyl (VASCEPA) 1 g capsule Take 2 capsules (2 g total) by mouth 2 (two) times daily.   Lancets (FREESTYLE) lancets Check glucose up to 4 (four) times daily.   losartan (COZAAR) 50 MG tablet Take 1 tablet (50 mg total) by mouth daily.   meclizine (ANTIVERT) 25 MG tablet Take 1 tablet (25 mg total) by mouth 3 (three) times daily as needed for dizziness.   meloxicam (MOBIC) 15 MG tablet take 1 tablet, by mouth, every 24 hours with a meal for 2 weeks, then once every 24 hours if needed for pain.   metFORMIN (GLUCOPHAGE-XR) 500 MG 24 hr tablet Take 2 tablets (1,000 mg total) by mouth 2 (two) times daily.   sildenafil (VIAGRA) 100 MG tablet Take 0.5-1 tablets (50-100 mg total) by mouth as needed for erectile dysfunction.   simvastatin (ZOCOR) 40 MG tablet Take 1 tablet (40 mg total) by mouth daily.   sitaGLIPtin (JANUVIA) 100 MG tablet Take 1 tablet (100 mg total) by mouth daily.   triamcinolone cream (KENALOG) 0.1 % Apply 1 application topically 2 (two) times daily.   trimethoprim-polymyxin b (POLYTRIM) ophthalmic solution Place 1 drop into both eyes 4 (four) times daily for 7 days   No facility-administered medications prior to visit.    ROS        Objective:     There were no  vitals taken for this visit. {Vitals History (Optional):23777}  Physical Exam   No results found for any visits on 11/14/23. {Show previous labs (optional):23779}    Assessment & Plan:    Routine Health Maintenance and Physical Exam  Immunization History  Administered Date(s) Administered   Influenza, Seasonal, Injecte, Preservative Fre 05/02/2023   Influenza,inj,Quad PF,6+ Mos 05/18/2019, 06/24/2020, 05/02/2021, 08/28/2022   Tdap 02/28/2022   Zoster Recombinant(Shingrix) 02/26/2019, 05/18/2019    Health Maintenance  Topic Date Due   Pneumococcal Vaccine 82-71 Years old (1 of 2 - PCV) Never done   HIV Screening  Never done   Hepatitis C Screening  Never done   Colonoscopy  Never done    FOOT EXAM  03/01/2023   COVID-19 Vaccine (1 - 2024-25 season) Never done   HEMOGLOBIN A1C  04/04/2024   Diabetic kidney evaluation - eGFR measurement  05/01/2024   Diabetic kidney evaluation - Urine ACR  05/01/2024   OPHTHALMOLOGY EXAM  10/05/2024   DTaP/Tdap/Td (2 - Td or Tdap) 02/29/2032   INFLUENZA VACCINE  Completed   Zoster Vaccines- Shingrix  Completed   HPV VACCINES  Aged Out    Discussed health benefits of physical activity, and encouraged him to engage in regular exercise appropriate for his age and condition.  Problem List Items Addressed This Visit     Essential hypertension   Hyperlipidemia associated with type 2 diabetes mellitus (HCC)   Type 2 diabetes mellitus with hyperglycemia, without long-term current use of insulin (HCC)   Other Visit Diagnoses       Annual physical exam    -  Primary      No follow-ups on file.     Christen Butter, NP

## 2023-12-01 ENCOUNTER — Other Ambulatory Visit (HOSPITAL_COMMUNITY): Payer: Self-pay

## 2023-12-29 ENCOUNTER — Other Ambulatory Visit (HOSPITAL_COMMUNITY): Payer: Self-pay

## 2023-12-29 ENCOUNTER — Encounter: Payer: Self-pay | Admitting: Medical-Surgical

## 2023-12-30 ENCOUNTER — Other Ambulatory Visit: Payer: Self-pay

## 2024-01-29 ENCOUNTER — Other Ambulatory Visit (HOSPITAL_COMMUNITY): Payer: Self-pay

## 2024-01-30 ENCOUNTER — Other Ambulatory Visit (HOSPITAL_COMMUNITY): Payer: Self-pay

## 2024-02-13 ENCOUNTER — Ambulatory Visit: Payer: 59 | Admitting: Medical-Surgical

## 2024-02-23 ENCOUNTER — Other Ambulatory Visit (HOSPITAL_COMMUNITY): Payer: Self-pay

## 2024-02-27 ENCOUNTER — Other Ambulatory Visit: Payer: Self-pay | Admitting: Medical-Surgical

## 2024-02-27 ENCOUNTER — Other Ambulatory Visit (HOSPITAL_COMMUNITY): Payer: Self-pay

## 2024-02-27 DIAGNOSIS — E1169 Type 2 diabetes mellitus with other specified complication: Secondary | ICD-10-CM

## 2024-02-27 DIAGNOSIS — E1165 Type 2 diabetes mellitus with hyperglycemia: Secondary | ICD-10-CM

## 2024-03-02 ENCOUNTER — Other Ambulatory Visit (HOSPITAL_COMMUNITY): Payer: Self-pay

## 2024-03-02 ENCOUNTER — Encounter (HOSPITAL_COMMUNITY): Payer: Self-pay

## 2024-03-04 ENCOUNTER — Encounter: Admitting: Medical-Surgical

## 2024-03-31 ENCOUNTER — Other Ambulatory Visit: Payer: Self-pay | Admitting: Medical-Surgical

## 2024-03-31 ENCOUNTER — Encounter (HOSPITAL_COMMUNITY): Payer: Self-pay

## 2024-03-31 ENCOUNTER — Other Ambulatory Visit (HOSPITAL_COMMUNITY): Payer: Self-pay

## 2024-03-31 DIAGNOSIS — E1165 Type 2 diabetes mellitus with hyperglycemia: Secondary | ICD-10-CM

## 2024-03-31 DIAGNOSIS — E1169 Type 2 diabetes mellitus with other specified complication: Secondary | ICD-10-CM

## 2024-04-02 ENCOUNTER — Other Ambulatory Visit: Payer: Self-pay | Admitting: Medical-Surgical

## 2024-04-02 ENCOUNTER — Other Ambulatory Visit (HOSPITAL_COMMUNITY): Payer: Self-pay

## 2024-04-02 DIAGNOSIS — E1165 Type 2 diabetes mellitus with hyperglycemia: Secondary | ICD-10-CM

## 2024-04-02 DIAGNOSIS — E1169 Type 2 diabetes mellitus with other specified complication: Secondary | ICD-10-CM

## 2024-04-02 MED ORDER — METFORMIN HCL ER 500 MG PO TB24
1000.0000 mg | ORAL_TABLET | Freq: Two times a day (BID) | ORAL | 0 refills | Status: DC
  Filled 2024-04-02: qty 120, 30d supply, fill #0

## 2024-04-02 MED ORDER — LOSARTAN POTASSIUM 50 MG PO TABS
50.0000 mg | ORAL_TABLET | Freq: Every day | ORAL | 0 refills | Status: AC
  Filled 2024-04-02: qty 30, 30d supply, fill #0
  Filled 2024-05-12: qty 30, 30d supply, fill #1
  Filled 2024-09-23: qty 30, 30d supply, fill #2

## 2024-04-02 MED ORDER — SIMVASTATIN 40 MG PO TABS
40.0000 mg | ORAL_TABLET | Freq: Every day | ORAL | 0 refills | Status: DC
  Filled 2024-04-02: qty 30, 30d supply, fill #0

## 2024-04-06 NOTE — Progress Notes (Unsigned)
 Complete physical exam  Patient: Johnathan Warner   DOB: [birth year]   58 y.o. Male  MRN: [id number]  Subjective:    No chief complaint on file.   Shea Lipke is a 58 y.o. male who presents today for a complete physical exam. He reports consuming a {diet types:17450} diet. {types:19826} He generally feels {DESC; WELL/FAIRLY WELL/POORLY:18703}. He reports sleeping {DESC; WELL/FAIRLY WELL/POORLY:18703}. He {does/does not:200015} have additional problems to discuss today.    Most recent fall risk assessment:    10/06/2023   11:26 AM  Fall Risk   Falls in the past year? 0  Number falls in past yr: 0  Injury with Fall? 0  Risk for fall due to : No Fall Risks  Follow up Falls evaluation completed     Most recent depression screenings:    10/06/2023   11:25 AM 08/28/2022   10:34 AM  PHQ 2/9 Scores  PHQ - 2 Score 0 0    {VISON DENTAL STD PSA (Optional):27386}  {History (Optional):23778}  Patient Care Team: Willo Mini, NP as PCP - General (Nurse Practitioner)   Outpatient Medications Prior to Visit  Medication Sig   acetaminophen (TYLENOL) 500 MG tablet Take 500 mg by mouth every 6 (six) hours as needed.   aspirin  EC 81 MG tablet Take 1 tablet (81 mg total) by mouth daily.   betamethasone  dipropionate 0.05 % cream Apply topically 2 (two) times daily to affected eczema area(s) as needed   Blood Glucose Monitoring Suppl (FREESTYLE LITE) w/Device KIT Use as directed   Continuous Glucose Sensor (FREESTYLE LIBRE 3 SENSOR) MISC Place 1 sensor on the skin every 14 days. Use to check glucose continuously   empagliflozin  (JARDIANCE ) 25 MG TABS tablet Take 1 tablet (25 mg total) by mouth daily.   fexofenadine  (ALLEGRA  ALLERGY) 180 MG tablet Take 1 tablet (180 mg total) by mouth daily.   glucose blood test strip Use up to 4 (four) times daily to check blood sugar   guaiFENesin (ROBITUSSIN) 100 MG/5ML liquid Take 5 mLs by mouth every 4 (four) hours as needed for cough or to loosen phlegm.    icosapent  Ethyl (VASCEPA ) 1 g capsule Take 2 capsules (2 g total) by mouth 2 (two) times daily.   Lancets (FREESTYLE) lancets Check glucose up to 4 (four) times daily.   losartan  (COZAAR ) 50 MG tablet Take 1 tablet (50 mg total) by mouth daily.   meclizine  (ANTIVERT ) 25 MG tablet Take 1 tablet (25 mg total) by mouth 3 (three) times daily as needed for dizziness.   meloxicam  (MOBIC ) 15 MG tablet take 1 tablet, by mouth, every 24 hours with a meal for 2 weeks, then once every 24 hours if needed for pain.   metFORMIN  (GLUCOPHAGE -XR) 500 MG 24 hr tablet Take 2 tablets (1,000 mg total) by mouth 2 (two) times daily.   sildenafil  (VIAGRA ) 100 MG tablet Take 0.5-1 tablets (50-100 mg total) by mouth as needed for erectile dysfunction.   simvastatin  (ZOCOR ) 40 MG tablet Take 1 tablet (40 mg total) by mouth daily.   sitaGLIPtin  (JANUVIA ) 100 MG tablet Take 1 tablet (100 mg total) by mouth daily.   triamcinolone  cream (KENALOG ) 0.1 % Apply 1 application topically 2 (two) times daily.   trimethoprim -polymyxin b  (POLYTRIM ) ophthalmic solution Place 1 drop into both eyes 4 (four) times daily for 7 days   No facility-administered medications prior to visit.    ROS        Objective:     There were no  vitals taken for this visit. {Vitals History (Optional):23777}  Physical Exam   No results found for any visits on 04/07/24. {Show previous labs (optional):23779}    Assessment & Plan:    Routine Health Maintenance and Physical Exam  Immunization History  Administered Date(s) Administered   Influenza, Seasonal, Injecte, Preservative Fre 05/02/2023   Influenza,inj,Quad PF,6+ Mos 05/18/2019, 06/24/2020, 05/02/2021, 08/28/2022   Tdap 02/28/2022   Zoster Recombinant(Shingrix ) 02/26/2019, 05/18/2019    Health Maintenance  Topic Date Due   HIV Screening  Never done   Hepatitis C Screening  Never done   Pneumococcal Vaccine: 19-49 Years (1 of 2 - PCV) Never done   Pneumococcal Vaccine: 50+ Years  (1 of 2 - PCV) Never done   Hepatitis B Vaccines (1 of 3 - 19+ 3-dose series) Never done   Colonoscopy  Never done   FOOT EXAM  03/01/2023   COVID-19 Vaccine (1 - 2024-25 season) Never done   INFLUENZA VACCINE  04/02/2024   HEMOGLOBIN A1C  04/04/2024   Diabetic kidney evaluation - eGFR measurement  05/01/2024   Diabetic kidney evaluation - Urine ACR  05/01/2024   OPHTHALMOLOGY EXAM  10/05/2024   DTaP/Tdap/Td (2 - Td or Tdap) 02/29/2032   Zoster Vaccines- Shingrix   Completed   HPV VACCINES  Aged Out   Meningococcal B Vaccine  Aged Out    Discussed health benefits of physical activity, and encouraged him to engage in regular exercise appropriate for his age and condition.  Problem List Items Addressed This Visit       Cardiovascular and Mediastinum   Essential hypertension - Primary     Endocrine   Hyperlipidemia associated with type 2 diabetes mellitus (HCC)   Type 2 diabetes mellitus with hyperglycemia, without long-term current use of insulin (HCC)   No follow-ups on file.     Hoang Pettingill, NP

## 2024-04-07 ENCOUNTER — Other Ambulatory Visit (HOSPITAL_COMMUNITY): Payer: Self-pay

## 2024-04-07 ENCOUNTER — Encounter: Payer: Self-pay | Admitting: Medical-Surgical

## 2024-04-07 ENCOUNTER — Ambulatory Visit (INDEPENDENT_AMBULATORY_CARE_PROVIDER_SITE_OTHER): Admitting: Medical-Surgical

## 2024-04-07 VITALS — BP 133/76 | HR 71 | Resp 20 | Ht 66.0 in | Wt 160.2 lb

## 2024-04-07 DIAGNOSIS — R809 Proteinuria, unspecified: Secondary | ICD-10-CM | POA: Diagnosis not present

## 2024-04-07 DIAGNOSIS — E1165 Type 2 diabetes mellitus with hyperglycemia: Secondary | ICD-10-CM | POA: Diagnosis not present

## 2024-04-07 DIAGNOSIS — E1169 Type 2 diabetes mellitus with other specified complication: Secondary | ICD-10-CM

## 2024-04-07 DIAGNOSIS — Z0000 Encounter for general adult medical examination without abnormal findings: Secondary | ICD-10-CM | POA: Diagnosis not present

## 2024-04-07 DIAGNOSIS — I10 Essential (primary) hypertension: Secondary | ICD-10-CM | POA: Diagnosis not present

## 2024-04-07 DIAGNOSIS — E785 Hyperlipidemia, unspecified: Secondary | ICD-10-CM

## 2024-04-07 DIAGNOSIS — Z7984 Long term (current) use of oral hypoglycemic drugs: Secondary | ICD-10-CM

## 2024-04-07 DIAGNOSIS — Z1211 Encounter for screening for malignant neoplasm of colon: Secondary | ICD-10-CM

## 2024-04-07 DIAGNOSIS — Z125 Encounter for screening for malignant neoplasm of prostate: Secondary | ICD-10-CM

## 2024-04-07 LAB — POCT URINALYSIS DIP (CLINITEK)
Bilirubin, UA: NEGATIVE
Glucose, UA: 500 mg/dL — AB
Ketones, POC UA: NEGATIVE mg/dL
Leukocytes, UA: NEGATIVE
Nitrite, UA: NEGATIVE
POC PROTEIN,UA: >=300 — AB
Spec Grav, UA: 1.025 (ref 1.010–1.025)
Urobilinogen, UA: 0.2 U/dL
pH, UA: 6 (ref 5.0–8.0)

## 2024-04-07 LAB — POCT UA - MICROALBUMIN
Albumin/Creatinine Ratio, Urine, POC: >300
Creatinine, POC: 100 mg/dL
Microalbumin Ur, POC: 150 mg/L

## 2024-04-07 LAB — POCT GLYCOSYLATED HEMOGLOBIN (HGB A1C)
HbA1c, POC (controlled diabetic range): 7.2 % — AB (ref 0.0–7.0)
Hemoglobin A1C: 7.2 % — AB (ref 4.0–5.6)

## 2024-04-07 MED ORDER — EMPAGLIFLOZIN 25 MG PO TABS
25.0000 mg | ORAL_TABLET | Freq: Every day | ORAL | 3 refills | Status: AC
  Filled 2024-04-07: qty 90, 90d supply, fill #0
  Filled 2024-04-08 – 2024-05-12 (×3): qty 30, 30d supply, fill #0
  Filled 2024-06-15: qty 30, 30d supply, fill #1
  Filled 2024-07-12: qty 30, 30d supply, fill #2
  Filled 2024-08-23: qty 30, 30d supply, fill #3
  Filled 2024-09-23: qty 30, 30d supply, fill #4

## 2024-04-07 MED ORDER — LOSARTAN POTASSIUM 50 MG PO TABS
50.0000 mg | ORAL_TABLET | Freq: Every day | ORAL | 3 refills | Status: AC
  Filled 2024-04-07: qty 90, 90d supply, fill #0
  Filled 2024-06-15: qty 30, 30d supply, fill #0
  Filled 2024-07-12: qty 30, 30d supply, fill #1
  Filled 2024-08-23: qty 30, 30d supply, fill #2

## 2024-04-07 MED ORDER — FREESTYLE LIBRE 3 PLUS SENSOR MISC
11 refills | Status: AC
  Filled 2024-04-07 – 2024-05-12 (×2): qty 2, 30d supply, fill #0
  Filled 2024-07-12: qty 2, 30d supply, fill #1
  Filled 2024-08-06: qty 2, 30d supply, fill #2
  Filled 2024-09-23: qty 2, 30d supply, fill #3

## 2024-04-07 MED ORDER — SIMVASTATIN 40 MG PO TABS
40.0000 mg | ORAL_TABLET | Freq: Every day | ORAL | 3 refills | Status: DC
  Filled 2024-04-07: qty 90, 90d supply, fill #0

## 2024-04-07 MED ORDER — TRIAMCINOLONE ACETONIDE 0.1 % EX CREA
1.0000 | TOPICAL_CREAM | Freq: Two times a day (BID) | CUTANEOUS | 0 refills | Status: AC
  Filled 2024-04-07: qty 30, 15d supply, fill #0

## 2024-04-07 MED ORDER — METFORMIN HCL ER 500 MG PO TB24
1000.0000 mg | ORAL_TABLET | Freq: Two times a day (BID) | ORAL | 3 refills | Status: AC
  Filled 2024-04-07: qty 360, 90d supply, fill #0
  Filled 2024-05-12: qty 120, 30d supply, fill #0

## 2024-04-07 NOTE — Patient Instructions (Signed)

## 2024-04-08 ENCOUNTER — Other Ambulatory Visit (HOSPITAL_COMMUNITY): Payer: Self-pay

## 2024-04-08 ENCOUNTER — Encounter: Payer: Self-pay | Admitting: Medical-Surgical

## 2024-04-08 ENCOUNTER — Ambulatory Visit: Payer: Self-pay | Admitting: Medical-Surgical

## 2024-04-08 ENCOUNTER — Telehealth: Payer: Self-pay

## 2024-04-08 DIAGNOSIS — E1169 Type 2 diabetes mellitus with other specified complication: Secondary | ICD-10-CM

## 2024-04-08 DIAGNOSIS — R801 Persistent proteinuria, unspecified: Secondary | ICD-10-CM

## 2024-04-08 LAB — CMP14+EGFR
ALT: 41 IU/L (ref 0–44)
AST: 27 IU/L (ref 0–40)
Albumin: 4.5 g/dL (ref 3.8–4.9)
Alkaline Phosphatase: 78 IU/L (ref 44–121)
BUN/Creatinine Ratio: 17 (ref 9–20)
BUN: 16 mg/dL (ref 6–24)
Bilirubin Total: 0.6 mg/dL (ref 0.0–1.2)
CO2: 23 mmol/L (ref 20–29)
Calcium: 9.8 mg/dL (ref 8.7–10.2)
Chloride: 100 mmol/L (ref 96–106)
Creatinine, Ser: 0.93 mg/dL (ref 0.76–1.27)
Globulin, Total: 2.5 g/dL (ref 1.5–4.5)
Glucose: 134 mg/dL — ABNORMAL HIGH (ref 70–99)
Potassium: 4.4 mmol/L (ref 3.5–5.2)
Sodium: 139 mmol/L (ref 134–144)
Total Protein: 7 g/dL (ref 6.0–8.5)
eGFR: 95 mL/min/1.73 (ref >59)

## 2024-04-08 LAB — CBC WITH DIFFERENTIAL/PLATELET
Basophils Absolute: 0.1 x10E3/uL (ref 0.0–0.2)
Basos: 1 %
EOS (ABSOLUTE): 0.2 x10E3/uL (ref 0.0–0.4)
Eos: 4 %
Hematocrit: 47.7 % (ref 37.5–51.0)
Hemoglobin: 15.2 g/dL (ref 13.0–17.7)
Immature Grans (Abs): 0 x10E3/uL (ref 0.0–0.1)
Immature Granulocytes: 0 %
Lymphocytes Absolute: 2.1 x10E3/uL (ref 0.7–3.1)
Lymphs: 35 %
MCH: 30.8 pg (ref 26.6–33.0)
MCHC: 31.9 g/dL (ref 31.5–35.7)
MCV: 97 fL (ref 79–97)
Monocytes Absolute: 0.3 x10E3/uL (ref 0.1–0.9)
Monocytes: 6 %
Neutrophils Absolute: 3.2 x10E3/uL (ref 1.4–7.0)
Neutrophils: 54 %
Platelets: 287 x10E3/uL (ref 150–450)
RBC: 4.93 x10E6/uL (ref 4.14–5.80)
RDW: 12.3 % (ref 11.6–15.4)
WBC: 6 x10E3/uL (ref 3.4–10.8)

## 2024-04-08 LAB — PSA TOTAL (REFLEX TO FREE): Prostate Specific Ag, Serum: 0.3 ng/mL (ref 0.0–4.0)

## 2024-04-08 LAB — LIPID PANEL
Chol/HDL Ratio: 4.9 ratio (ref 0.0–5.0)
Cholesterol, Total: 163 mg/dL (ref 100–199)
HDL: 33 mg/dL — ABNORMAL LOW (ref >39)
LDL Chol Calc (NIH): 91 mg/dL (ref 0–99)
Triglycerides: 229 mg/dL — ABNORMAL HIGH (ref 0–149)
VLDL Cholesterol Cal: 39 mg/dL (ref 5–40)

## 2024-04-08 LAB — VITAMIN B12: Vitamin B-12: 451 pg/mL (ref 232–1245)

## 2024-04-08 MED ORDER — SIMVASTATIN 80 MG PO TABS
80.0000 mg | ORAL_TABLET | Freq: Every day | ORAL | 3 refills | Status: AC
  Filled 2024-04-08: qty 30, 30d supply, fill #0
  Filled 2024-05-12: qty 30, 30d supply, fill #1
  Filled 2024-06-15: qty 30, 30d supply, fill #2
  Filled 2024-07-12: qty 30, 30d supply, fill #3
  Filled 2024-08-23: qty 30, 30d supply, fill #4
  Filled 2024-09-23: qty 30, 30d supply, fill #5

## 2024-04-08 NOTE — Progress Notes (Unsigned)
   04/08/2024  Patient ID: Johnathan Warner, male   DOB: 24-Jun-1966, 58 y.o.   MRN: [id number]  This guy is already on Jardiance , metformin , and Januvia . He is on losartan  as well. His microalbumin is still showing high abnormal. A1c is 7.2% today so much better. Any recommendations for the microalbumin issue? He is likely not going to be interested in another medication but it's worth a try. Considered a GLP-1 but I think it will be a hard sell for an injection.   Honestly, outside of adding a GLP1, I do not have other medication recommendations.  Would he be more open to Rybelsus versus and injection, maybe?  I would also focus on lifestyle modifications like decreasing sodium and increasing physical activity if possible.  Feel free to put in a referral if you would like for me to further discuss with him- I am happy to!

## 2024-04-10 LAB — URINE CULTURE: Organism ID, Bacteria: NO GROWTH

## 2024-04-12 ENCOUNTER — Encounter: Payer: Self-pay | Admitting: Medical-Surgical

## 2024-05-04 ENCOUNTER — Encounter: Payer: Self-pay | Admitting: Sports Medicine

## 2024-05-12 ENCOUNTER — Other Ambulatory Visit (HOSPITAL_COMMUNITY): Payer: Self-pay

## 2024-05-13 ENCOUNTER — Other Ambulatory Visit (HOSPITAL_COMMUNITY): Payer: Self-pay

## 2024-06-15 ENCOUNTER — Other Ambulatory Visit (HOSPITAL_COMMUNITY): Payer: Self-pay

## 2024-06-21 ENCOUNTER — Other Ambulatory Visit: Payer: Self-pay

## 2024-06-21 ENCOUNTER — Other Ambulatory Visit (HOSPITAL_COMMUNITY): Payer: Self-pay

## 2024-06-21 MED ORDER — AMOXICILLIN 500 MG PO CAPS
500.0000 mg | ORAL_CAPSULE | Freq: Three times a day (TID) | ORAL | 0 refills | Status: AC
  Filled 2024-06-21: qty 30, 10d supply, fill #0

## 2024-07-12 ENCOUNTER — Other Ambulatory Visit (HOSPITAL_COMMUNITY): Payer: Self-pay

## 2024-08-06 ENCOUNTER — Other Ambulatory Visit (HOSPITAL_COMMUNITY): Payer: Self-pay

## 2024-08-23 ENCOUNTER — Other Ambulatory Visit (HOSPITAL_COMMUNITY): Payer: Self-pay

## 2024-08-23 ENCOUNTER — Other Ambulatory Visit: Payer: Self-pay

## 2024-09-23 ENCOUNTER — Other Ambulatory Visit: Payer: Self-pay

## 2024-09-23 ENCOUNTER — Other Ambulatory Visit (HOSPITAL_COMMUNITY): Payer: Self-pay

## 2024-10-01 ENCOUNTER — Ambulatory Visit: Admitting: Medical-Surgical

## 2024-11-05 ENCOUNTER — Ambulatory Visit: Admitting: Medical-Surgical
